# Patient Record
Sex: Female | Born: 1995 | Race: Black or African American | Hispanic: No | Marital: Single | Smoking: Never smoker
Health system: Southern US, Community
[De-identification: ages and names within clinical notes are randomized; demographics above are authoritative.]

## PROBLEM LIST (undated history)

## (undated) DIAGNOSIS — Z789 Other specified health status: Secondary | ICD-10-CM

## (undated) DIAGNOSIS — N200 Calculus of kidney: Secondary | ICD-10-CM

## (undated) HISTORY — PX: NO PAST SURGERIES: SHX2092

---
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
## 2016-09-21 ENCOUNTER — Other Ambulatory Visit: Payer: Self-pay | Admitting: Physician Assistant

## 2016-09-21 DIAGNOSIS — Z79899 Other long term (current) drug therapy: Secondary | ICD-10-CM

## 2016-09-21 DIAGNOSIS — Z3042 Encounter for surveillance of injectable contraceptive: Secondary | ICD-10-CM

## 2017-07-01 ENCOUNTER — Encounter (HOSPITAL_COMMUNITY): Payer: Self-pay | Admitting: Emergency Medicine

## 2017-07-01 ENCOUNTER — Emergency Department (HOSPITAL_COMMUNITY): Payer: Self-pay

## 2017-07-01 ENCOUNTER — Emergency Department (HOSPITAL_COMMUNITY)
Admission: EM | Admit: 2017-07-01 | Discharge: 2017-07-01 | Disposition: A | Discharge status: Home / Self Care | Payer: Self-pay | Attending: Emergency Medicine | Admitting: Emergency Medicine

## 2017-07-01 DIAGNOSIS — W228XXA Striking against or struck by other objects, initial encounter: Secondary | ICD-10-CM | POA: Insufficient documentation

## 2017-07-01 DIAGNOSIS — S92514A Nondisplaced fracture of proximal phalanx of right lesser toe(s), initial encounter for closed fracture: Secondary | ICD-10-CM | POA: Insufficient documentation

## 2017-07-01 DIAGNOSIS — S92504A Nondisplaced unspecified fracture of right lesser toe(s), initial encounter for closed fracture: Secondary | ICD-10-CM

## 2017-07-01 DIAGNOSIS — Y999 Unspecified external cause status: Secondary | ICD-10-CM | POA: Insufficient documentation

## 2017-07-01 DIAGNOSIS — Y939 Activity, unspecified: Secondary | ICD-10-CM | POA: Insufficient documentation

## 2017-07-01 DIAGNOSIS — Y929 Unspecified place or not applicable: Secondary | ICD-10-CM | POA: Insufficient documentation

## 2017-07-01 MED ORDER — IBUPROFEN 400 MG PO TABS
400.0000 mg | ORAL_TABLET | Freq: Once | ORAL | Status: AC
  Administered 2017-07-01: 400 mg via ORAL
  Filled 2017-07-01: qty 1

## 2017-07-01 MED ORDER — TRAMADOL HCL 50 MG PO TABS
50.0000 mg | ORAL_TABLET | Freq: Once | ORAL | Status: AC
  Administered 2017-07-01: 50 mg via ORAL
  Filled 2017-07-01: qty 1

## 2017-07-01 MED ORDER — TRAMADOL HCL 50 MG PO TABS: 50.0000 mg | ORAL_TABLET | Freq: Four times a day (QID) | ORAL | 0 refills | Status: DC | PRN

## 2017-07-01 NOTE — Discharge Instructions (Signed)
Rest, Ice intermittently (in the first 24-48 hours), Gentle compression with an Ace wrap, and elevate (Limb above the level of the heart)   Take up to 800mg  of ibuprofen (that is usually 4 over the counter pills)  3 times a day for 5 days. Take with food.  For breakthrough pain you may take Tramadol. Do not drink alcohol drive or operate heavy machinery when taking Tramadol.  Please follow with your primary care doctor in the next 2 days for a check-up. They must obtain records for further management.   Do not hesitate to return to the Emergency Department for any new, worsening or concerning symptoms.

## 2017-07-01 NOTE — ED Triage Notes (Signed)
Pt to ED with complaints of Right toe pain after hitting it on a dresser yesterday.

## 2017-07-01 NOTE — Progress Notes (Signed)
Orthopedic Tech Progress Note Patient Details:  Sherilyn Cootersabella J Debroux 1996-03-25 [id number]  Ortho Devices Type of Ortho Device: Crutches, Postop shoe/boot, Buddy tape Ortho Device/Splint Location: right foot/ large toe Ortho Device/Splint Interventions: Application, Adjustment   Alvina ChouWilliams, Jovon Winterhalter C 07/01/2017, 7:14 PM

## 2017-07-01 NOTE — ED Provider Notes (Signed)
[facility] Provider Note   CSN: [id number] Arrival date & time: 07/01/17  1521     History   Chief Complaint Chief Complaint  Patient presents with  . Toe Injury    HPI   Blood pressure 121/71, pulse 94, temperature 97.9 F (36.6 C), temperature source Oral, resp. rate 16, height 5\' 2"  (1.575 m), weight 56.7 kg (125 lb), SpO2 99 %.  Leah Martin is a 21 y.o. female complaining of right second toe pain and bruising after she stubbed it on a dresser earlier in the day. Pain has been severe and exacerbated by movement and palpation. Pain medication taken prior to arrival. Ambulatory with pain. Described as throbbing.   History reviewed. No pertinent past medical history.  There are no active problems to display for this patient.   History reviewed. No pertinent surgical history.  OB History    No data available       Home Medications    Prior to Admission medications   Medication Sig Start Date End Date Taking? Authorizing Provider  medroxyPROGESTERone (DEPO-PROVERA) 150 MG/ML injection Inject 150 mg into the muscle every 3 (three) months.   Yes [provider]    Family History No family history on file.  Social History Social History  Substance Use Topics  . Smoking status: Never Smoker  . Smokeless tobacco: Never Used  . Alcohol use No     Allergies   Patient has no known allergies.   Review of Systems Review of Systems  A complete review of systems was obtained and all systems are negative except as noted in the HPI and PMH.    Physical Exam Updated Vital Signs BP 121/71 (BP Location: Left Arm)   Pulse 94   Temp 97.9 F (36.6 C) (Oral)   Resp 16   Ht 5\' 2"  (1.575 m)   Wt 56.7 kg (125 lb)   SpO2 99%   BMI 22.86 kg/m   Physical Exam  Constitutional: She is oriented to person, place, and time. She appears well-developed and well-nourished. No distress.  HENT:  Head: Normocephalic and  atraumatic.  Mouth/Throat: Oropharynx is clear and moist.  Eyes: Pupils are equal, round, and reactive to light. Conjunctivae and EOM are normal.  Neck: Normal range of motion.  Cardiovascular: Normal rate, regular rhythm and intact distal pulses.   Pulmonary/Chest: Effort normal and breath sounds normal.  Abdominal: Soft. There is no tenderness.  Musculoskeletal: She exhibits edema and tenderness.  Ecchymoses and tenderness with trace swelling to right second toe. Wound is closed with no lacerations overlying, distally neurovascularly intact.  Neurological: She is alert and oriented to person, place, and time.  Skin: She is not diaphoretic.  Psychiatric: She has a normal mood and affect.  Nursing note and vitals reviewed.    ED Treatments / Results  Labs (all labs ordered are listed, but only abnormal results are displayed) Labs Reviewed - No data to display  EKG  EKG Interpretation None       Radiology Dg Foot Complete Right  Result Date: 07/01/2017 CLINICAL DATA:  Right second toe pain EXAM: RIGHT FOOT COMPLETE - 3+ VIEW COMPARISON:  None. FINDINGS: Nondisplaced oblique fracture of the right second proximal phalanx. No intra-articular extension. No other fracture. The other bones of the right foot are normal. IMPRESSION: Nondisplaced, oblique, extra-articular right second proximal phalangeal fracture. Electronically Signed   By: Deatra RobinsonKevin  Herman M.D.   On: 07/01/2017 18:20    Procedures Procedures (  including critical care time)  Medications Ordered in ED Medications - No data to display   Initial Impression / Assessment and Plan / ED Course  I have reviewed the triage vital signs and the nursing notes.  Pertinent labs & imaging results that were available during my care of the patient were reviewed by me and considered in my medical decision making (see chart for details).    Vitals:   07/01/17 1555  BP: 121/71  Pulse: 94  Resp: 16  Temp: 97.9 F (36.6 C)    TempSrc: Oral  SpO2: 99%  Weight: 56.7 kg (125 lb)  Height: 5\' 2"  (1.575 m)    Medications  ibuprofen (ADVIL,MOTRIN) tablet 400 mg (400 mg Oral Given 07/01/17 1856)  traMADol (ULTRAM) tablet 50 mg (50 mg Oral Given 07/01/17 1856)    Leah Martin is 21 y.o. female presenting with Toe pain after stubbing it against a dresser. Wound is closed, proximal phalanx fracture. Buddy taped with postop shoe and crutches, short prescription of tramadol for pain control.  Evaluation does not show pathology that would require ongoing emergent intervention or inpatient treatment. Pt is hemodynamically stable and mentating appropriately. Discussed findings and plan with patient/guardian, who agrees with care plan. All questions answered. Return precautions discussed and outpatient follow up given.      Final Clinical Impressions(s) / ED Diagnoses   Final diagnoses:  None    New Prescriptions New Prescriptions   No medications on file     [name] 07/01/17 1859    Lavera Guise, MD 07/03/17 [phone]

## 2017-07-01 NOTE — ED Notes (Signed)
Declined W/C at D/C and was escorted to lobby by RN. 

## 2017-10-26 ENCOUNTER — Emergency Department (HOSPITAL_BASED_OUTPATIENT_CLINIC_OR_DEPARTMENT_OTHER): Payer: Medicaid Other

## 2017-10-26 ENCOUNTER — Other Ambulatory Visit: Payer: Self-pay

## 2017-10-26 ENCOUNTER — Encounter (HOSPITAL_BASED_OUTPATIENT_CLINIC_OR_DEPARTMENT_OTHER): Payer: Self-pay | Admitting: Emergency Medicine

## 2017-10-26 ENCOUNTER — Emergency Department (HOSPITAL_BASED_OUTPATIENT_CLINIC_OR_DEPARTMENT_OTHER)
Admission: EM | Admit: 2017-10-26 | Discharge: 2017-10-26 | Disposition: A | Discharge status: Home / Self Care | Payer: Medicaid Other | Attending: Emergency Medicine | Admitting: Emergency Medicine

## 2017-10-26 DIAGNOSIS — N12 Tubulo-interstitial nephritis, not specified as acute or chronic: Secondary | ICD-10-CM

## 2017-10-26 DIAGNOSIS — B9689 Other specified bacterial agents as the cause of diseases classified elsewhere: Secondary | ICD-10-CM | POA: Insufficient documentation

## 2017-10-26 DIAGNOSIS — N760 Acute vaginitis: Secondary | ICD-10-CM | POA: Insufficient documentation

## 2017-10-26 LAB — BASIC METABOLIC PANEL
Anion gap: 7 (ref 5–15)
BUN: 10 mg/dL (ref 6–20)
CALCIUM: 9 mg/dL (ref 8.9–10.3)
CO2: 24 mmol/L (ref 22–32)
Chloride: 108 mmol/L (ref 101–111)
Creatinine, Ser: 0.72 mg/dL (ref 0.44–1.00)
GFR calc non Af Amer: >60 mL/min (ref >60)
Glucose, Bld: 98 mg/dL (ref 65–99)
Potassium: 4.2 mmol/L (ref 3.5–5.1)
SODIUM: 139 mmol/L (ref 135–145)

## 2017-10-26 LAB — URINALYSIS, ROUTINE W REFLEX MICROSCOPIC
Bilirubin Urine: NEGATIVE
GLUCOSE, UA: NEGATIVE mg/dL
HGB URINE DIPSTICK: NEGATIVE
Ketones, ur: NEGATIVE mg/dL
LEUKOCYTES UA: NEGATIVE
Nitrite: POSITIVE — AB
PROTEIN: NEGATIVE mg/dL
Specific Gravity, Urine: 1.025 (ref 1.005–1.030)
pH: 7 (ref 5.0–8.0)

## 2017-10-26 LAB — CBC WITH DIFFERENTIAL/PLATELET
BASOS PCT: 0 %
Basophils Absolute: 0 10*3/uL (ref 0.0–0.1)
EOS ABS: 0 10*3/uL (ref 0.0–0.7)
EOS PCT: 1 %
HCT: 39.2 % (ref 36.0–46.0)
Hemoglobin: 13.2 g/dL (ref 12.0–15.0)
LYMPHS ABS: 2.4 10*3/uL (ref 0.7–4.0)
Lymphocytes Relative: 42 %
MCH: 31.3 pg (ref 26.0–34.0)
MCHC: 33.7 g/dL (ref 30.0–36.0)
MCV: 92.9 fL (ref 78.0–100.0)
MONOS PCT: 13 %
Monocytes Absolute: 0.7 10*3/uL (ref 0.1–1.0)
Neutro Abs: 2.5 10*3/uL (ref 1.7–7.7)
Neutrophils Relative %: 44 %
PLATELETS: 198 10*3/uL (ref 150–400)
RBC: 4.22 MIL/uL (ref 3.87–5.11)
RDW: 12.2 % (ref 11.5–15.5)
WBC: 5.6 10*3/uL (ref 4.0–10.5)

## 2017-10-26 LAB — URINALYSIS, MICROSCOPIC (REFLEX)

## 2017-10-26 LAB — PREGNANCY, URINE: PREG TEST UR: NEGATIVE

## 2017-10-26 LAB — WET PREP, GENITAL
SPERM: NONE SEEN
Trich, Wet Prep: NONE SEEN
YEAST WET PREP: NONE SEEN

## 2017-10-26 MED ORDER — METRONIDAZOLE 500 MG PO TABS: 500.0000 mg | ORAL_TABLET | Freq: Two times a day (BID) | ORAL | 0 refills | Status: DC

## 2017-10-26 MED ORDER — CIPROFLOXACIN HCL 500 MG PO TABS: 500.0000 mg | ORAL_TABLET | Freq: Two times a day (BID) | ORAL | 0 refills | Status: DC

## 2017-10-26 MED ORDER — CEFTRIAXONE SODIUM 1 G IJ SOLR
1.0000 g | Freq: Once | INTRAMUSCULAR | Status: AC
  Administered 2017-10-26: 1 g via INTRAMUSCULAR
  Filled 2017-10-26: qty 10

## 2017-10-26 NOTE — ED Triage Notes (Signed)
Patient states that she is having pain to her bilateral flank - Left greater than right. Was recently dx with a UTI and treated for it.

## 2017-10-26 NOTE — ED Notes (Signed)
PA informed x 2 that pelvic set up was ready

## 2017-10-26 NOTE — Discharge Instructions (Signed)
You have a urinary tract infection, given symptoms and pain to your right flank this is likely early kidney infection. Take ciprofloxacin as prescribed. Vaginal swabs showed bacterial vaginosis, this is a vaginal infection and not a sexually transmitted disease. Take Flagyl for this. Note that Flagyl can cause nausea and abdominal pain, you may take with food if this happens. Do not consume alcohol while taking Flagyl or a week afterwards as it will cause severe nausea, vomiting, abdominal pain. Take Tylenol or ibuprofen for pain. Stay well-hydrated. Return to the emergency department if you develop fevers, chills, abdominal pain, inability to urinate. You declined STD treatment today, your vaginal swab testing for gonorrhea and chlamydia are pending. You will be called in 1-2 days if these results are positive. If they are positive you need to alert your partners and seek treatment.

## 2017-10-26 NOTE — ED Provider Notes (Signed)
[facility] EMERGENCY DEPARTMENT Provider Note   CSN: [id number] Arrival date & time: 10/26/17  1135     History   Chief Complaint Chief Complaint  Patient presents with  . Flank Pain    HPI Leah Martin is a 22 y.o. female with history of recent UTI is here for evaluation of dysuria, frequency, darker urine, suprapubic/bladder fullness and mild pain that radiates to hurt right flank for the last week. . She finished taken Bactrim 2 weeks ago, she was asymptomatic for about a week but symptoms returned. She denies fevers, chills, nausea, vomiting, chest pain, shortness of breath, abnormal vaginal bleeding, diarrhea, constipation. Since finishing back trauma she has noticed increased vaginal discharge, she has history of recent infections in the past and thinks that this is similar. She is sexually active with out condom use. Denies history of STD, PID. Partner is not having any symptoms. She appears to not be concerned about possible STDs. No previous abdominal surgeries. No history of kidney stones. No interventions PTA. No aggravating or alleviating factors.  HPI  History reviewed. No pertinent past medical history.  There are no active problems to display for this patient.   History reviewed. No pertinent surgical history.  OB History    No data available       Home Medications    Prior to Admission medications   Medication Sig Start Date End Date Taking? Authorizing Provider  ciprofloxacin (CIPRO) 500 MG tablet Take 1 tablet (500 mg total) by mouth 2 (two) times daily. 10/26/17   Liberty Handy, PA-C  medroxyPROGESTERone (DEPO-PROVERA) 150 MG/ML injection Inject 150 mg into the muscle every 3 (three) months.    [provider]  metroNIDAZOLE (FLAGYL) 500 MG tablet Take 1 tablet (500 mg total) by mouth 2 (two) times daily. 10/26/17   Liberty Handy, PA-C  traMADol (ULTRAM) 50 MG tablet Take 1 tablet (50 mg total) by mouth every 6 (six) hours  as needed. 07/01/17   Pisciotta, Mardella Layman    Family History History reviewed. No pertinent family history.  Social History Social History   Tobacco Use  . Smoking status: Never Smoker  . Smokeless tobacco: Never Used  Substance Use Topics  . Alcohol use: No  . Drug use: No     Allergies   Patient has no known allergies.   Review of Systems Review of Systems  Gastrointestinal: Positive for abdominal pain.  Genitourinary: Positive for difficulty urinating, dysuria, frequency, urgency and vaginal discharge.  All other systems reviewed and are negative.    Physical Exam Updated Vital Signs BP (!) 117/56 (BP Location: Left Arm)   Pulse 73   Temp 98.4 F (36.9 C) (Oral)   Resp 16   Ht 5\' 2"  (1.575 m)   Wt 56.7 kg (125 lb)   SpO2 100%   BMI 22.86 kg/m   Physical Exam  Constitutional: She is oriented to person, place, and time. She appears well-developed and well-nourished. No distress.  NAD. Nontoxic.  HENT:  Head: Normocephalic and atraumatic.  Right Ear: External ear normal.  Left Ear: External ear normal.  Nose: Nose normal.  Moist mucous membranes.  Eyes: Conjunctivae and EOM are normal. No scleral icterus.  Neck: Normal range of motion. Neck supple.  Cardiovascular: Normal rate, regular rhythm and normal heart sounds.  No murmur heard. Pulmonary/Chest: Effort normal and breath sounds normal. She has no wheezes.  Abdominal: Soft. There is no tenderness.  No suprapubic tenderness. She has mild right  CVA tenderness. No guarding, rigidity, rebound.  Genitourinary: Vaginal discharge found.  Genitourinary Comments:  External genitalia normal without erythema, edema, tenderness, discharge or lesions.  No groin lymphadenopathy.  Vaginal mucosa cervix normal, pink with small amount of thin/white grayish discharge. Negative whiff test.  Uterus in midline, smooth, not enlarged or tender. No CMT. Non palpable adnexa.  Musculoskeletal: Normal range of motion. She  exhibits no deformity.  Neurological: She is alert and oriented to person, place, and time.  Skin: Skin is warm and dry. Capillary refill takes less than 2 seconds.  Psychiatric: She has a normal mood and affect. Her behavior is normal. Judgment and thought content normal.  Nursing note and vitals reviewed.    ED Treatments / Results  Labs (all labs ordered are listed, but only abnormal results are displayed) Labs Reviewed  WET PREP, GENITAL - Abnormal; Notable for the following components:      Result Value   Clue Cells Wet Prep HPF POC PRESENT (*)    WBC, Wet Prep HPF POC MANY (*)    All other components within normal limits  URINALYSIS, ROUTINE W REFLEX MICROSCOPIC - Abnormal; Notable for the following components:   APPearance CLOUDY (*)    Nitrite POSITIVE (*)    All other components within normal limits  URINALYSIS, MICROSCOPIC (REFLEX) - Abnormal; Notable for the following components:   Bacteria, UA MANY (*)    Squamous Epithelial / LPF 0-5 (*)    All other components within normal limits  URINE CULTURE  PREGNANCY, URINE  CBC WITH DIFFERENTIAL/PLATELET  BASIC METABOLIC PANEL  GC/CHLAMYDIA PROBE AMP ([place]) NOT AT [facility]    EKG  EKG Interpretation None       Radiology Ct Renal Stone Study  Result Date: 10/26/2017 CLINICAL DATA:  Right-sided flank pain for 1 month EXAM: CT ABDOMEN AND PELVIS WITHOUT CONTRAST TECHNIQUE: Multidetector CT imaging of the abdomen and pelvis was performed following the standard protocol without IV contrast. COMPARISON:  None. FINDINGS: Lower chest: No acute abnormality. Hepatobiliary: No focal liver abnormality is seen. No gallstones, gallbladder wall thickening, or biliary dilatation. Pancreas: Unremarkable. No pancreatic ductal dilatation or surrounding inflammatory changes. Spleen: Normal in size without focal abnormality. Adrenals/Urinary Tract: Adrenal glands are unremarkable. Kidneys are normal, without renal calculi, focal lesion,  or hydronephrosis. Bladder is unremarkable. Stomach/Bowel: Stomach is within normal limits. Appendix appears normal. No evidence of bowel wall thickening, distention, or inflammatory changes. Vascular/Lymphatic: No significant vascular findings are present. No enlarged abdominal or pelvic lymph nodes. Reproductive: Uterus and bilateral adnexa are unremarkable. Other: Some soft tissue inflammatory changes are noted just above the pubic symphysis anteriorly. Musculoskeletal: No acute or significant osseous findings. IMPRESSION: No renal calculi or obstructive changes are noted. Mild soft tissue inflammation is noted just above the pubic symphysis anteriorly. This is of uncertain significance. Correlation with the physical exam and any recent trauma is recommended. Electronically Signed   By: Alcide CleverMark  Lukens M.D.   On: 10/26/2017 15:13    Procedures Procedures (including critical care time)  Medications Ordered in ED Medications  cefTRIAXone (ROCEPHIN) injection 1 g (1 g Intramuscular Given 10/26/17 1541)     Initial Impression / Assessment and Plan / ED Course  I have reviewed the triage vital signs and the nursing notes.  Pertinent labs & imaging results that were available during my care of the patient were reviewed by me and considered in my medical decision making (see chart for details).  Clinical Course as of Oct 26 1721  Tue Oct 26, 2017  1442 Nitrite: (!) POSITIVE [CG]  1442 Appearance: (!) CLOUDY [CG]  1442 Pulse Rate: (!) 121 [CG]    Clinical Course User Index [CG] Liberty Handy, PA-C   Given right CVA tenderness in setting of UTI likely pyelonephritis. Will obtain labs and CT renal. Plan for pelvic exam.   Final Clinical Impressions(s) / ED Diagnoses   No leukocytosis.  Creatinine normal. Negative pregnancy. CT renal w/o signs of obstructing stone, hydronephrosis, appendic appears normal.  Pelvic and abdominal exam is benign without peritoneal signs, only scant amount of  gray/thin vaginal discharge consistent with bacterial vaginosis, clue cells present on wet prep. I called pharmacy who recommended ciprofloxacin in this young patient without comorbidities. Her urine was sent for culture. Will discharge with Flagyl for bacterial vaginosis. Discussed return precautions. Final diagnoses:  Pyelonephritis  Bacterial vaginosis    ED Discharge Orders        Ordered    ciprofloxacin (CIPRO) 500 MG tablet  2 times daily     10/26/17 1658    metroNIDAZOLE (FLAGYL) 500 MG tablet  2 times daily     10/26/17 1658       Liberty Handy, [place] 10/26/17 1723    Pricilla Loveless, MD 10/27/17 [phone]

## 2017-10-26 NOTE — ED Notes (Signed)
ED Provider at bedside. 

## 2017-10-27 LAB — GC/CHLAMYDIA PROBE AMP (~~LOC~~) NOT AT ARMC
Chlamydia: NEGATIVE
Neisseria Gonorrhea: NEGATIVE

## 2017-10-28 LAB — URINE CULTURE

## 2017-10-29 ENCOUNTER — Telehealth: Payer: Self-pay

## 2017-10-29 NOTE — Telephone Encounter (Signed)
Post ED Visit - Positive Culture Follow-up  Culture report reviewed by antimicrobial stewardship pharmacist:  [x]  Enzo BiNathan Batchelder, Pharm.D. []  Celedonio MiyamotoJeremy Frens, Pharm.D., BCPS AQ-ID []  Garvin FilaMike Maccia, Pharm.D., BCPS []  Georgina PillionElizabeth Martin, Pharm.D., BCPS []  BediasMinh Pham, [address]Pharm.D., BCPS, AAHIVP []  Estella HuskMichelle Turner, Pharm.D., BCPS, AAHIVP []  Lysle Pearlachel Rumbarger, PharmD, BCPS []  Blake DivineShannon Parkey, PharmD []  Pollyann SamplesAndy Johnston, PharmD, BCPS  Positive urine culture Treated with Coprofloxacin, organism sensitive to the same and no further patient follow-up is required at this time.  Jerry CarasCullom, Ahnaf Caponi Burnett 10/29/2017, 10:04 AM

## 2018-07-12 ENCOUNTER — Encounter (HOSPITAL_COMMUNITY): Payer: Self-pay

## 2018-07-12 ENCOUNTER — Emergency Department (HOSPITAL_COMMUNITY)
Admission: EM | Admit: 2018-07-12 | Discharge: 2018-07-13 | Disposition: A | Discharge status: Home / Self Care | Payer: Self-pay | Attending: Emergency Medicine | Admitting: Emergency Medicine

## 2018-07-12 ENCOUNTER — Emergency Department (HOSPITAL_COMMUNITY): Payer: Self-pay

## 2018-07-12 ENCOUNTER — Other Ambulatory Visit: Payer: Self-pay

## 2018-07-12 DIAGNOSIS — M25511 Pain in right shoulder: Secondary | ICD-10-CM | POA: Insufficient documentation

## 2018-07-12 DIAGNOSIS — R071 Chest pain on breathing: Secondary | ICD-10-CM | POA: Insufficient documentation

## 2018-07-12 LAB — CBC
HEMATOCRIT: 43.8 % (ref 36.0–46.0)
Hemoglobin: 13.9 g/dL (ref 12.0–15.0)
MCH: 29.4 pg (ref 26.0–34.0)
MCHC: 31.7 g/dL (ref 30.0–36.0)
MCV: 92.8 fL (ref 80.0–100.0)
PLATELETS: 240 10*3/uL (ref 150–400)
RBC: 4.72 MIL/uL (ref 3.87–5.11)
RDW: 12.4 % (ref 11.5–15.5)
WBC: 8.7 10*3/uL (ref 4.0–10.5)
nRBC: 0 % (ref 0.0–0.2)

## 2018-07-12 LAB — I-STAT BETA HCG BLOOD, ED (MC, WL, AP ONLY): I-stat hCG, quantitative: <5 m[IU]/mL (ref <5)

## 2018-07-12 LAB — I-STAT TROPONIN, ED: TROPONIN I, POC: 0 ng/mL (ref 0.00–0.08)

## 2018-07-12 LAB — BASIC METABOLIC PANEL
ANION GAP: 8 (ref 5–15)
BUN: 12 mg/dL (ref 6–20)
CO2: 24 mmol/L (ref 22–32)
Calcium: 9.4 mg/dL (ref 8.9–10.3)
Chloride: 108 mmol/L (ref 98–111)
Creatinine, Ser: 0.7 mg/dL (ref 0.44–1.00)
GFR calc Af Amer: >60 mL/min (ref >60)
GFR calc non Af Amer: >60 mL/min (ref >60)
GLUCOSE: 102 mg/dL — AB (ref 70–99)
Potassium: 3.7 mmol/L (ref 3.5–5.1)
Sodium: 140 mmol/L (ref 135–145)

## 2018-07-12 NOTE — ED Triage Notes (Signed)
Pt reports that she has had rt side chest pain sharp 9/10. Pt reports with deep breathing pain worsens and rt side shoulder pain. Pt states that she has had a similar episode 3 days ago. Pt reports smoking Hx.

## 2018-07-12 NOTE — ED Notes (Signed)
Pt called from triage with no answer 

## 2018-07-13 NOTE — ED Notes (Signed)
Pt not in the lobby 

## 2019-06-23 ENCOUNTER — Telehealth: Payer: Self-pay | Admitting: *Deleted

## 2019-06-23 DIAGNOSIS — O3680X0 Pregnancy with inconclusive fetal viability, not applicable or unspecified: Secondary | ICD-10-CM

## 2019-06-23 NOTE — Telephone Encounter (Signed)
Received a voicemail from [place] at [facility] from 06/22/19 pm stating they have seen Leah Martin . State she told them she went to the ER 9/26 and got proof of pregnancy and states they did bhcg and told her she is about 2 months along.  She states she also was treated for a UTI. Leah Martin denies bleeding or cramping. She states she came off ocp's in July and then has 2 days of bleeding at end of August.   Leah Martin states she did an [place] which showed a large gestational sac large enough she expected to see a baby - looked around 8-10 wk size; but she could not see a baby. Is referring to [place] for follow up.  I called and scheduled [place] for 10/19/ 20 1:00 because No viability [place] are done on Friday's. I called [name] and left a message I am calling because you were referred to [place]. We have scheduled you an [place] for 07/03/19 at 1:00 at [address]. Please call us if questions.  I called [facility] and left a message we received your message and have assumed her care. I sent a message to registrars to put her on schedule for results after [place].  Linda,RN

## 2019-06-23 NOTE — Telephone Encounter (Signed)
I called Leah Martin and she confirmed she got my earlier message. I reviewed with her that her [place] appointment and that after the [place] she will come to our office for the results. I also instructed her if she has bleeding or severe pain to go to [facility] for evaluation. She voices understanding.  Jacques Navy

## 2019-07-03 ENCOUNTER — Other Ambulatory Visit: Payer: Self-pay

## 2019-07-03 ENCOUNTER — Ambulatory Visit (HOSPITAL_COMMUNITY)
Admission: RE | Admit: 2019-07-03 | Discharge: 2019-07-03 | Disposition: A | Discharge status: Home / Self Care | Payer: Self-pay | Source: Ambulatory Visit | Attending: Medical | Admitting: Medical

## 2019-07-03 ENCOUNTER — Ambulatory Visit (INDEPENDENT_AMBULATORY_CARE_PROVIDER_SITE_OTHER): Payer: Self-pay | Admitting: Emergency Medicine

## 2019-07-03 DIAGNOSIS — O3680X0 Pregnancy with inconclusive fetal viability, not applicable or unspecified: Secondary | ICD-10-CM

## 2019-07-03 NOTE — Progress Notes (Signed)
Agree with A & P. 

## 2019-07-03 NOTE — Progress Notes (Signed)
Pt here today for u/s results. Results reviewed with Dr Rip Harbour. Verbal order to have repeat u/s done in 10 days.   Pt informed of u/s date 10/29 @ 10am and instructed to arrive 15 minutes early with a full bladder. Pt reports occasional mild cramping and denies any bleeding. Pt encouraged to be seen at the hospital for severe pain or bleeding that soaks a pad in an hour or less. Pt verbalized understanding and had no further questions.   Loma Sousa, RN 07/03/19   [phone]

## 2019-07-13 ENCOUNTER — Ambulatory Visit (HOSPITAL_COMMUNITY)
Admission: RE | Admit: 2019-07-13 | Discharge: 2019-07-13 | Disposition: A | Discharge status: Home / Self Care | Payer: Self-pay | Source: Ambulatory Visit | Attending: Obstetrics and Gynecology | Admitting: Obstetrics and Gynecology

## 2019-07-13 ENCOUNTER — Ambulatory Visit (INDEPENDENT_AMBULATORY_CARE_PROVIDER_SITE_OTHER): Payer: Self-pay | Admitting: Obstetrics & Gynecology

## 2019-07-13 ENCOUNTER — Other Ambulatory Visit: Payer: Self-pay

## 2019-07-13 DIAGNOSIS — O039 Complete or unspecified spontaneous abortion without complication: Secondary | ICD-10-CM

## 2019-07-13 DIAGNOSIS — O3680X0 Pregnancy with inconclusive fetal viability, not applicable or unspecified: Secondary | ICD-10-CM | POA: Insufficient documentation

## 2019-07-13 MED ORDER — IBUPROFEN 800 MG PO TABS: 800.0000 mg | ORAL_TABLET | Freq: Three times a day (TID) | ORAL | 1 refills | Status: DC | PRN

## 2019-07-13 MED ORDER — MISOPROSTOL 200 MCG PO TABS: ORAL_TABLET | ORAL | 0 refills | Status: DC

## 2019-07-13 NOTE — Addendum Note (Signed)
Addended by: Emily Filbert on: 07/13/2019 10:58 AM   Modules accepted: Orders

## 2019-07-13 NOTE — Progress Notes (Signed)
   Subjective:    Patient ID: Leah Martin, female    DOB: 1996/04/11, 23 y.o.   MRN: [id number]  HPI 23 yo single G0 here for follow up u/s. She had the u/s today that met definitive criteria for failed pregnancy. She does want a pregnancy, does not want contraception.   Review of Systems     Objective:   Physical Exam Breathing, conversing, and ambulating normally Well nourished, well hydrated Black female, no apparent distress     Assessment & Plan:  Preventative care- she would like a flu vaccine but we are out of stock Miscarriage- type and screen today She was offered watchful waiting versus cytotec. She opts for cytotec. Rec PNVs daily to help prevent ONTDs She will get her pap smear when her medicaid becomes active.

## 2019-07-13 NOTE — Addendum Note (Signed)
Addended by: Grier Mitts L on: 07/13/2019 11:00 AM   Modules accepted: Orders

## 2019-07-14 LAB — ABO/RH: Rh Factor: POSITIVE

## 2019-08-05 ENCOUNTER — Encounter (HOSPITAL_COMMUNITY): Payer: Self-pay

## 2019-08-05 ENCOUNTER — Inpatient Hospital Stay (HOSPITAL_COMMUNITY): Payer: Medicaid Other

## 2019-08-05 ENCOUNTER — Other Ambulatory Visit: Payer: Self-pay

## 2019-08-05 ENCOUNTER — Inpatient Hospital Stay (HOSPITAL_COMMUNITY)
Admission: AD | Admit: 2019-08-05 | Discharge: 2019-08-05 | Disposition: A | Discharge status: Home / Self Care | Payer: Medicaid Other | Attending: Obstetrics & Gynecology | Admitting: Obstetrics & Gynecology

## 2019-08-05 DIAGNOSIS — O4690 Antepartum hemorrhage, unspecified, unspecified trimester: Secondary | ICD-10-CM

## 2019-08-05 DIAGNOSIS — Z792 Long term (current) use of antibiotics: Secondary | ICD-10-CM | POA: Insufficient documentation

## 2019-08-05 DIAGNOSIS — Z79899 Other long term (current) drug therapy: Secondary | ICD-10-CM | POA: Insufficient documentation

## 2019-08-05 DIAGNOSIS — O034 Incomplete spontaneous abortion without complication: Secondary | ICD-10-CM | POA: Diagnosis present

## 2019-08-05 DIAGNOSIS — O021 Missed abortion: Secondary | ICD-10-CM | POA: Insufficient documentation

## 2019-08-05 LAB — CBC
HCT: 36.2 % (ref 36.0–46.0)
Hemoglobin: 12 g/dL (ref 12.0–15.0)
MCH: 30.5 pg (ref 26.0–34.0)
MCHC: 33.1 g/dL (ref 30.0–36.0)
MCV: 91.9 fL (ref 80.0–100.0)
Platelets: 243 10*3/uL (ref 150–400)
RBC: 3.94 MIL/uL (ref 3.87–5.11)
RDW: 12.8 % (ref 11.5–15.5)
WBC: 10.4 10*3/uL (ref 4.0–10.5)
nRBC: 0 % (ref 0.0–0.2)

## 2019-08-05 LAB — URINALYSIS, ROUTINE W REFLEX MICROSCOPIC
Bacteria, UA: NONE SEEN
Bilirubin Urine: NEGATIVE
Glucose, UA: NEGATIVE mg/dL
Ketones, ur: NEGATIVE mg/dL
Nitrite: NEGATIVE
Protein, ur: 30 mg/dL — AB
RBC / HPF: >50 RBC/hpf — ABNORMAL HIGH (ref 0–5)
Specific Gravity, Urine: 1.021 (ref 1.005–1.030)
pH: 7 (ref 5.0–8.0)

## 2019-08-05 LAB — COMPREHENSIVE METABOLIC PANEL
ALT: 21 U/L (ref 0–44)
AST: 14 U/L — ABNORMAL LOW (ref 15–41)
Albumin: 3.4 g/dL — ABNORMAL LOW (ref 3.5–5.0)
Alkaline Phosphatase: 53 U/L (ref 38–126)
Anion gap: 10 (ref 5–15)
BUN: 6 mg/dL (ref 6–20)
CO2: 21 mmol/L — ABNORMAL LOW (ref 22–32)
Calcium: 8.8 mg/dL — ABNORMAL LOW (ref 8.9–10.3)
Chloride: 105 mmol/L (ref 98–111)
Creatinine, Ser: 0.7 mg/dL (ref 0.44–1.00)
GFR calc Af Amer: >60 mL/min (ref >60)
GFR calc non Af Amer: >60 mL/min (ref >60)
Glucose, Bld: 100 mg/dL — ABNORMAL HIGH (ref 70–99)
Potassium: 3.7 mmol/L (ref 3.5–5.1)
Sodium: 136 mmol/L (ref 135–145)
Total Bilirubin: 0.6 mg/dL (ref 0.3–1.2)
Total Protein: 6.4 g/dL — ABNORMAL LOW (ref 6.5–8.1)

## 2019-08-05 LAB — HCG, QUANTITATIVE, PREGNANCY: hCG, Beta Chain, Quant, S: 865 m[IU]/mL — ABNORMAL HIGH (ref <5)

## 2019-08-05 LAB — WET PREP, GENITAL
Sperm: NONE SEEN
Trich, Wet Prep: NONE SEEN
Yeast Wet Prep HPF POC: NONE SEEN

## 2019-08-05 LAB — POCT PREGNANCY, URINE: Preg Test, Ur: POSITIVE — AB

## 2019-08-05 NOTE — MAU Provider Note (Addendum)
History     CSN: [id number]  Arrival date and time: 08/05/19 4098   First Provider Initiated Contact with Patient 08/05/19 1922      Chief Complaint  Patient presents with  . Abdominal Pain  . Possible Pregnancy  . Vaginal Bleeding   Leah Martin is a 23 y.o. G1P0010 at Unknown who presents to MAU for vaginal bleeding which began 07/26/2019 after she took the misoprostol pills that were prescribed for her on 07/13/2019 when she was diagnosed with a failed pregnancy. Pt reports the bleeding had been getting lighter, but the last three days has gotten heavier.  Passing blood clots? yes Blood soaking clothes? no Lightheaded/dizzy? no Significant pelvic pain or cramping? yes, pt rates pelvic pain as 9/10 with pressure Passed any tissue? no  Blood Type? B positive Allergies? NKDA Current medications? none Current PNC & next appt? none  Pt denies vaginal discharge/odor/itching. Pt denies N/V, abdominal pain, constipation, diarrhea, or urinary problems. Pt denies fever, chills, fatigue, sweating or changes in appetite. Pt denies SOB or chest pain. Pt denies dizziness, HA, light-headedness, weakness.   OB History    Gravida  1   Para      Term      Preterm      AB  1   Living        SAB  1   TAB      Ectopic      Multiple      Live Births              History reviewed. No pertinent past medical history.  History reviewed. No pertinent surgical history.  Family History  Problem Relation Age of Onset  . Kidney disease Mother     Social History   Tobacco Use  . Smoking status: Never Smoker  . Smokeless tobacco: Never Used  Substance Use Topics  . Alcohol use: No  . Drug use: No    Allergies: No Known Allergies  Medications Prior to Admission  Medication Sig Dispense Refill Last Dose  . ciprofloxacin (CIPRO) 500 MG tablet Take 1 tablet (500 mg total) by mouth 2 (two) times daily. 14 tablet 0   . ibuprofen (ADVIL) 800 MG tablet  Take 1 tablet (800 mg total) by mouth every 8 (eight) hours as needed. 60 tablet 1   . medroxyPROGESTERone (DEPO-PROVERA) 150 MG/ML injection Inject 150 mg into the muscle every 3 (three) months.     . metroNIDAZOLE (FLAGYL) 500 MG tablet Take 1 tablet (500 mg total) by mouth 2 (two) times daily. 14 tablet 0   . misoprostol (CYTOTEC) 200 MCG tablet Take 4 pills by mouth 4 tablet 0   . traMADol (ULTRAM) 50 MG tablet Take 1 tablet (50 mg total) by mouth every 6 (six) hours as needed. 11 tablet 0     Review of Systems  Constitutional: Negative for chills, diaphoresis, fatigue and fever.  Eyes: Negative for visual disturbance.  Respiratory: Negative for shortness of breath.   Cardiovascular: Negative for chest pain.  Gastrointestinal: Negative for abdominal pain, constipation, diarrhea, nausea and vomiting.  Genitourinary: Positive for pelvic pain and vaginal bleeding. Negative for dysuria, flank pain, frequency, urgency and vaginal discharge.  Neurological: Negative for dizziness, weakness, light-headedness and headaches.   Physical Exam   Blood pressure 119/67, pulse 98, temperature 99.3 F (37.4 C), temperature source Oral, resp. rate 16, height  (1.575 m), weight 64 kg, SpO2 100 %.  Patient Vitals for the past 24  hrs:  BP Temp Temp src Pulse Resp SpO2 Height Weight  08/05/19 1839 119/67 99.3 F (37.4 C) Oral 98 16 100 % -- --  08/05/19 1835 -- -- -- -- -- -- 5\' 2"  (1.575 m) 64 kg   Physical Exam  Constitutional: She is oriented to person, place, and time. She appears well-developed and well-nourished. No distress.  HENT:  Head: Normocephalic and atraumatic.  Respiratory: Effort normal.  GI: Soft. She exhibits no distension and no mass. There is abdominal tenderness in the left lower quadrant. There is no rebound and no guarding.  Genitourinary: There is no rash, tenderness or lesion on the right labia. There is no rash, tenderness or lesion on the left labia. Uterus is not  enlarged and not tender. Cervix exhibits no motion tenderness, no discharge and no friability. Right adnexum displays no mass, no tenderness and no fullness. Left adnexum displays no mass, no tenderness and no fullness.    Vaginal bleeding (minimal blood in vaginal vault, minute bleeding from external os) present.     No vaginal discharge or tenderness.  There is bleeding (minimal blood in vaginal vault, minute bleeding from external os) in the vagina. No tenderness in the vagina.    Genitourinary Comments: CE: long/closed   Neurological: She is alert and oriented to person, place, and time.  Skin: Skin is warm and dry. She is not diaphoretic.  Psychiatric: She has a normal mood and affect. Her behavior is normal. Judgment and thought content normal.   Results for orders placed or performed during the hospital encounter of 08/05/19 (from the past 24 hour(s))  Urinalysis, Routine w reflex microscopic     Status: Abnormal   Collection Time: 08/05/19  6:48 PM  Result Value Ref Range   Color, Urine YELLOW YELLOW   APPearance HAZY (A) CLEAR   Specific Gravity, Urine 1.021 1.005 - 1.030   pH 7.0 5.0 - 8.0   Glucose, UA NEGATIVE NEGATIVE mg/dL   Hgb urine dipstick LARGE (A) NEGATIVE   Bilirubin Urine NEGATIVE NEGATIVE   Ketones, ur NEGATIVE NEGATIVE mg/dL   Protein, ur 30 (A) NEGATIVE mg/dL   Nitrite NEGATIVE NEGATIVE   Leukocytes,Ua TRACE (A) NEGATIVE   RBC / HPF >50 (H) 0 - 5 RBC/hpf   WBC, UA 21-50 0 - 5 WBC/hpf   Bacteria, UA NONE SEEN NONE SEEN   Squamous Epithelial / LPF 0-5 0 - 5   Mucus PRESENT   Pregnancy, urine POC     Status: Abnormal   Collection Time: 08/05/19  6:49 PM  Result Value Ref Range   Preg Test, Ur POSITIVE (A) NEGATIVE  CBC     Status: None   Collection Time: 08/05/19  7:27 PM  Result Value Ref Range   WBC 10.4 4.0 - 10.5 K/uL   RBC 3.94 3.87 - 5.11 MIL/uL   Hemoglobin 12.0 12.0 - 15.0 g/dL   HCT 36.2 36.0 - 46.0 %   MCV 91.9 80.0 - 100.0 fL   MCH 30.5 26.0 -  34.0 pg   MCHC 33.1 30.0 - 36.0 g/dL   RDW 12.8 11.5 - 15.5 %   Platelets 243 150 - 400 K/uL   nRBC 0.0 0.0 - 0.2 %  Comprehensive metabolic panel     Status: Abnormal   Collection Time: 08/05/19  7:27 PM  Result Value Ref Range   Sodium 136 135 - 145 mmol/L   Potassium 3.7 3.5 - 5.1 mmol/L   Chloride 105 98 - 111 mmol/L  CO2 21 (L) 22 - 32 mmol/L   Glucose, Bld 100 (H) 70 - 99 mg/dL   BUN 6 6 - 20 mg/dL   Creatinine, Ser 1.61 0.44 - 1.00 mg/dL   Calcium 8.8 (L) 8.9 - 10.3 mg/dL   Total Protein 6.4 (L) 6.5 - 8.1 g/dL   Albumin 3.4 (L) 3.5 - 5.0 g/dL   AST 14 (L) 15 - 41 U/L   ALT 21 0 - 44 U/L   Alkaline Phosphatase 53 38 - 126 U/L   Total Bilirubin 0.6 0.3 - 1.2 mg/dL   GFR calc non Af Amer >60 >60 mL/min   GFR calc Af Amer >60 >60 mL/min   Anion gap 10 5 - 15  hCG, quantitative, pregnancy     Status: Abnormal   Collection Time: 08/05/19  7:27 PM  Result Value Ref Range   hCG, Beta Chain, Quant, S 865 (H) <5 mIU/mL  Wet prep, genital     Status: Abnormal   Collection Time: 08/05/19  7:39 PM   Specimen: Cervix  Result Value Ref Range   Yeast Wet Prep HPF POC NONE SEEN NONE SEEN   Trich, Wet Prep NONE SEEN NONE SEEN   Clue Cells Wet Prep HPF POC PRESENT (A) NONE SEEN   WBC, Wet Prep HPF POC FEW (A) NONE SEEN   Sperm NONE SEEN    US Ob Transvaginal  Result Date: 07/13/2019 CLINICAL DATA:  23 year old pregnant female presents for assessment of fetal dating and viability. Uncertain LMP. EXAM: TRANSVAGINAL OB ULTRASOUND TECHNIQUE: Transvaginal ultrasound was performed for complete evaluation of the gestation as well as the maternal uterus, adnexal regions, and pelvic cul-de-sac. COMPARISON:  07/03/2019 obstetric scan. FINDINGS: Intrauterine gestational sac: Single irregular intrauterine sac-like structure with minimal heterogeneous internal echoes. Yolk sac:  Not Visualized. Embryo:  Not Visualized. Cardiac Activity: Not Visualized. MSD: 16.8 mm   7 w   2 d Subchorionic  hemorrhage:  None visualized. Maternal uterus/adnexae: Right ovary measures 3.7 x 2.2 x 2.8 cm. Left ovary measures 3.2 x 2.1 x 1.6 cm. No abnormal ovarian or adnexal masses. No abnormal free fluid in the pelvis. Anteverted uterus with no uterine fibroids demonstrated. Small amount of fluid noted in the endocervical canal. IMPRESSION: 1. Single irregular intrauterine sac-like structure with heterogeneous internal echoes, decreased in size since 07/03/2019 scan. Previously demonstrated yolk sac is not visualized on today's scan. No embryo. Findings meet definitive criteria for nonviable pregnancy. This follows SRU consensus guidelines: Diagnostic Criteria for Nonviable Pregnancy Early in the First Trimester. [facility] Mis J Med [phone]. 2. No ovarian or adnexal abnormality. Electronically Signed   By: Delbert Phenix M.D.   On: 07/13/2019 10:21   MAU Course  Procedures  MDM -r/o ectopic -UA: hazy/lg hgb/30PRO/trace leuks, sending urine for culture -CBC: WNL, H/H 12/36.2 -CMP: no abnormalities requiring treatment -hCG: 865 -ABO: B Positive -WetPrep: +ClueCells (isolated finding), otherwise WNL -GC/CT collected -care transferred to Gerrit Heck, CNM, pending [place] results  Orders Placed This Encounter  Procedures  . Wet prep, genital    Standing Status:   Standing    Number of Occurrences:   1  . Culture, OB Urine    Standing Status:   Standing    Number of Occurrences:   1  . US OB LESS THAN 14 WEEKS WITH OB TRANSVAGINAL    Standing Status:   Standing    Number of Occurrences:   1    Order Specific Question:   Symptom/Reason for Exam  Answer:   Vaginal bleeding in pregnancy [[id number]]  . Urinalysis, Routine w reflex microscopic    Standing Status:   Standing    Number of Occurrences:   1  . CBC    Standing Status:   Standing    Number of Occurrences:   1  . Comprehensive metabolic panel    Standing Status:   Standing    Number of Occurrences:   1  . hCG, quantitative, pregnancy     Standing Status:   Standing    Number of Occurrences:   1  . Pregnancy, urine POC    Standing Status:   Standing    Number of Occurrences:   1   No orders of the defined types were placed in this encounter.  Assessment and Plan  [place] Ob Less Than 14 Weeks With Ob Transvaginal  Result Date: 08/05/2019 CLINICAL DATA:  Increased vaginal bleeding history of recent SAB EXAM: OBSTETRIC <14 WK US AND TRANSVAGINAL OB US TECHNIQUE: Both transabdominal and transvaginal ultrasound examinations were performed for complete evaluation of the gestation as well as the maternal uterus, adnexal regions, and pelvic cul-de-sac. Transvaginal technique was performed to assess early pregnancy. COMPARISON:  07/13/2019, 07/03/2019 FINDINGS: Intrauterine gestational sac: Irregular intrauterine fluid collection with scattered internal echoes. This now extends into the lower uterine segment. Increased vascularity is noted. Yolk sac:  Not seen Embryo:  Not seen Cardiac Activity: Not seen Subchorionic hemorrhage:  None visualized. Maternal uterus/adnexae: Ovaries are within normal limits. The left ovary measures 3.5 x 1.5 x 1.6 cm. The right ovary measures 3.2 x 1.8 by 1.4 cm. No significant free fluid. IMPRESSION: 1. Irregular sac like structure present within the uterus extending from fundus to lower uterine segment with thickened vascular endometrium. No visible yolk sac or embryo. Given history of recent SAB, findings raise concern for retained products of conception. Electronically Signed   By: Jasmine PangKim  Fujinaga M.D.   On: 08/05/2019 21:27   Reassessment (9:46 PM) Retained Products   -US reports shows retained products despite cytotec dosing. -In room to discuss results with patient. -Informed that stability of iron level shows no significant lose of blood. -Discussed how expectant management is not a recommended option as she was diagnosed almost a month ago. -Given option for repeat cytotec and office follow up or D&C  procedure.  -Patient states she tried to schedule a D&C at another facility and was denied because of her insurance. -[facility] -Reviewed procedure scheduling and preparation including COVID testing and self-quarantine. -Patient informed that she would be called on Monday or Tuesday with date and time. -Bleeding Precautions Reviewed. -Request for procedure sent. -Encouraged to call or return to MAU if symptoms worsen or with the onset of new symptoms. -Discharged to home in stable condition.   Cherre RobinsJessica L Dejohn Ibarra MSN, CNM Advanced Practice Provider, [facility]

## 2019-08-05 NOTE — Discharge Instructions (Signed)
Dilation and Curettage or Vacuum Curettage ° °Dilation and curettage (D&C) and vacuum curettage are minor procedures. A D&C involves stretching (dilation) the cervix and scraping (curettage) the inside lining of the uterus (endometrium). During a D&C, tissue is gently scraped from the endometrium, starting from the top portion of the uterus down to the lowest part of the uterus (cervix). During a vacuum curettage, the lining and tissue in the uterus are removed with the use of gentle suction. °Curettage may be performed to either diagnose or treat a problem. As a diagnostic procedure, curettage is performed to examine tissues from the uterus. A diagnostic curettage may be done if you have: °· Irregular bleeding in the uterus. °· Bleeding with the development of clots. °· Spotting between menstrual periods. °· Prolonged menstrual periods or other abnormal bleeding. °· Bleeding after menopause. °· No menstrual period (amenorrhea). °· A change in size and shape of the uterus. °· Abnormal endometrial cells discovered during a Pap test. °As a treatment procedure, curettage may be performed for the following reasons: °· Removal of an IUD (intrauterine device). °· Removal of retained placenta after giving birth. °· Abortion. °· Miscarriage. °· Removal of endometrial polyps. °· Removal of uncommon types of noncancerous lumps (fibroids). °Tell a health care provider about: °· Any allergies you have, including allergies to prescribed medicine or latex. °· All medicines you are taking, including vitamins, herbs, eye drops, creams, and over-the-counter medicines. This is especially important if you take any blood-thinning medicine. Bring a list of all of your medicines to your appointment. °· Any problems you or family members have had with anesthetic medicines. °· Any blood disorders you have. °· Any surgeries you have had. °· Your medical history and any medical conditions you have. °· Whether you are pregnant or may be  pregnant. °· Recent vaginal infections you have had. °· Recent menstrual periods, bleeding problems you have had, and what form of birth control (contraception) you use. °What are the risks? °Generally, this is a safe procedure. However, problems may occur, including: °· Infection. °· Heavy vaginal bleeding. °· Allergic reactions to medicines. °· Damage to the cervix or other structures or organs. °· Development of scar tissue (adhesions) inside the uterus, which can cause abnormal amounts of menstrual bleeding. This may make it harder to get pregnant in the future. °· A hole (perforation) or puncture in the uterine wall. This is rare. °What happens before the procedure? °Staying hydrated °Follow instructions from your health care provider about hydration, which may include: °· Up to 2 hours before the procedure - you may continue to drink clear liquids, such as water, clear fruit juice, black coffee, and plain tea. °Eating and drinking restrictions °Follow instructions from your health care provider about eating and drinking, which may include: °· 8 hours before the procedure - stop eating heavy meals or foods such as meat, fried foods, or fatty foods. °· 6 hours before the procedure - stop eating light meals or foods, such as toast or cereal. °· 6 hours before the procedure - stop drinking milk or drinks that contain milk. °· 2 hours before the procedure - stop drinking clear liquids. If your health care provider told you to take your medicine(s) on the day of your procedure, take them with only a sip of water. °Medicines °· Ask your health care provider about: °? Changing or stopping your regular medicines. This is especially important if you are taking diabetes medicines or blood thinners. °? Taking medicines such as aspirin   and ibuprofen. These medicines can thin your blood. Do not take these medicines before your procedure if your health care provider instructs you not to. °· You may be given antibiotic  medicine to help prevent infection. °General instructions °· For 24 hours before your procedure, do not: °? Douche. °? Use tampons. °? Use medicines, creams, or suppositories in the vagina. °? Have sexual intercourse. °· You may be given a pregnancy test on the day of the procedure. °· Plan to have someone take you home from the hospital or clinic. °· You may have a blood or urine sample taken. °· If you will be going home right after the procedure, plan to have someone with you for 24 hours. °What happens during the procedure? °· To reduce your risk of infection: °? Your health care team will wash or sanitize their hands. °? Your skin will be washed with soap. °· An IV tube will be inserted into one of your veins. °· You will be given one of the following: °? A medicine that numbs the area in and around the cervix (local anesthetic). °? A medicine to make you fall asleep (general anesthetic). °· You will lie down on your back, with your feet in foot rests (stirrups). °· The size and position of your uterus will be checked. °· A lubricated instrument (speculum or Sims retractor) will be inserted into the back side of your vagina. The speculum will be used to hold apart the walls of your vagina so your health care provider can see your cervix. °· A tool (tenaculum) will be attached to the lip of the cervix to stabilize it. °· Your cervix will be softened and dilated. This may be done by: °? Taking a medicine. °? Having tapered dilators or thin rods (laminaria) or gradual widening instruments (tapered dilators) inserted into your cervix. °· A small, sharp, curved instrument (curette) will be used to scrape a small amount of tissue or cells from the endometrium or cervical canal. In some cases, gentle suction is applied with the curette. The curette will then be removed. The cells will be taken to a lab for testing. °The procedure may vary among health care providers and hospitals. °What happens after the  procedure? °· You may have mild cramping, backache, pain, and light bleeding or spotting. You may pass small blood clots from your vagina. °· You may have to wear compression stockings. These stockings help to prevent blood clots and reduce swelling in your legs. °· Your blood pressure, heart rate, breathing rate, and blood oxygen level will be monitored until the medicines you were given have worn off. °Summary °· Dilation and curettage (D&C) involves stretching (dilation) the cervix and scraping (curettage) the inside lining of the uterus (endometrium). °· After the procedure, you may have mild cramping, backache, pain, and light bleeding or spotting. You may pass small blood clots from your vagina. °· Plan to have someone take you home from the hospital or clinic. °This information is not intended to replace advice given to you by your health care provider. Make sure you discuss any questions you have with your health care provider. °Document Released: 08/31/2005 Document Revised: 08/13/2017 Document Reviewed: 05/17/2016 °Elsevier Patient Education © 2020 [employer] ° °

## 2019-08-05 NOTE — MAU Note (Signed)
Leah Martin is a 23 y.o. here in MAU reporting: had a a miscarriage at the end of October. Was Rx cytotec and had lots of bleeding with that. The bleeding stopped for 3 days and then restarted 2 days ago. States she is changing a pad every 30 minutes and they are saturated. Also having lower abdominal pain and pressure. Has not taken another UPT.  Onset of complaint: 2 days  Pain score: 8/10  Vitals:   08/05/19 1839  BP: 119/67  Pulse: 98  Resp: 16  Temp: 99.3 F (37.4 C)  SpO2: 100%     Lab orders placed from triage: UPT

## 2019-08-07 ENCOUNTER — Other Ambulatory Visit: Payer: Self-pay | Admitting: Obstetrics and Gynecology

## 2019-08-07 ENCOUNTER — Other Ambulatory Visit (HOSPITAL_COMMUNITY)
Admission: RE | Admit: 2019-08-07 | Discharge: 2019-08-07 | Disposition: A | Discharge status: Home / Self Care | Payer: Medicaid Other | Source: Ambulatory Visit | Attending: Obstetrics and Gynecology | Admitting: Obstetrics and Gynecology

## 2019-08-07 ENCOUNTER — Encounter (HOSPITAL_BASED_OUTPATIENT_CLINIC_OR_DEPARTMENT_OTHER): Payer: Self-pay | Admitting: *Deleted

## 2019-08-07 ENCOUNTER — Other Ambulatory Visit: Payer: Self-pay

## 2019-08-07 ENCOUNTER — Other Ambulatory Visit (HOSPITAL_COMMUNITY): Payer: Self-pay | Admitting: Obstetrics and Gynecology

## 2019-08-07 DIAGNOSIS — Z20828 Contact with and (suspected) exposure to other viral communicable diseases: Secondary | ICD-10-CM | POA: Insufficient documentation

## 2019-08-07 DIAGNOSIS — O021 Missed abortion: Secondary | ICD-10-CM

## 2019-08-07 DIAGNOSIS — Z01812 Encounter for preprocedural laboratory examination: Secondary | ICD-10-CM | POA: Insufficient documentation

## 2019-08-07 LAB — CULTURE, OB URINE

## 2019-08-07 LAB — GC/CHLAMYDIA PROBE AMP (~~LOC~~) NOT AT ARMC
Chlamydia: NEGATIVE
Comment: NEGATIVE
Comment: NORMAL
Neisseria Gonorrhea: NEGATIVE

## 2019-08-07 LAB — SARS CORONAVIRUS 2 (TAT 6-24 HRS): SARS Coronavirus 2: NEGATIVE

## 2019-08-08 ENCOUNTER — Encounter (HOSPITAL_COMMUNITY): Payer: Self-pay | Admitting: Certified Registered Nurse Anesthetist

## 2019-08-09 ENCOUNTER — Telehealth: Payer: Self-pay

## 2019-08-09 ENCOUNTER — Encounter (HOSPITAL_COMMUNITY): Payer: Self-pay | Admitting: *Deleted

## 2019-08-09 ENCOUNTER — Telehealth: Payer: Self-pay | Admitting: Obstetrics and Gynecology

## 2019-08-09 ENCOUNTER — Ambulatory Visit (HOSPITAL_COMMUNITY): Payer: Medicaid Other

## 2019-08-09 ENCOUNTER — Other Ambulatory Visit: Payer: Self-pay

## 2019-08-09 ENCOUNTER — Encounter (HOSPITAL_COMMUNITY)
Admission: AD | Disposition: A | Discharge status: Home / Self Care | Payer: Self-pay | Source: Home / Self Care | Attending: Obstetrics and Gynecology

## 2019-08-09 ENCOUNTER — Inpatient Hospital Stay (HOSPITAL_COMMUNITY): Payer: Self-pay | Admitting: Certified Registered Nurse Anesthetist

## 2019-08-09 ENCOUNTER — Inpatient Hospital Stay (HOSPITAL_COMMUNITY)
Admission: AD | Admit: 2019-08-09 | Discharge: 2019-08-09 | Disposition: A | Discharge status: Home / Self Care | Payer: Self-pay | Attending: Obstetrics and Gynecology | Admitting: Obstetrics and Gynecology

## 2019-08-09 ENCOUNTER — Inpatient Hospital Stay (HOSPITAL_COMMUNITY): Payer: Self-pay

## 2019-08-09 ENCOUNTER — Ambulatory Visit (HOSPITAL_BASED_OUTPATIENT_CLINIC_OR_DEPARTMENT_OTHER)
Admission: RE | Admit: 2019-08-09 | Payer: Medicaid Other | Source: Home / Self Care | Admitting: Obstetrics and Gynecology

## 2019-08-09 DIAGNOSIS — O034 Incomplete spontaneous abortion without complication: Secondary | ICD-10-CM | POA: Diagnosis present

## 2019-08-09 DIAGNOSIS — O021 Missed abortion: Secondary | ICD-10-CM

## 2019-08-09 DIAGNOSIS — Z369 Encounter for antenatal screening, unspecified: Secondary | ICD-10-CM | POA: Insufficient documentation

## 2019-08-09 DIAGNOSIS — R1110 Vomiting, unspecified: Secondary | ICD-10-CM | POA: Insufficient documentation

## 2019-08-09 DIAGNOSIS — N939 Abnormal uterine and vaginal bleeding, unspecified: Secondary | ICD-10-CM | POA: Insufficient documentation

## 2019-08-09 DIAGNOSIS — R1030 Lower abdominal pain, unspecified: Secondary | ICD-10-CM | POA: Insufficient documentation

## 2019-08-09 DIAGNOSIS — R509 Fever, unspecified: Secondary | ICD-10-CM | POA: Diagnosis present

## 2019-08-09 DIAGNOSIS — M791 Myalgia, unspecified site: Secondary | ICD-10-CM | POA: Insufficient documentation

## 2019-08-09 HISTORY — DX: Other specified health status: Z78.9

## 2019-08-09 HISTORY — PX: DILATION AND CURETTAGE OF UTERUS: SHX78

## 2019-08-09 LAB — URINALYSIS, ROUTINE W REFLEX MICROSCOPIC
Bilirubin Urine: NEGATIVE
Glucose, UA: NEGATIVE mg/dL
Ketones, ur: 20 mg/dL — AB
Nitrite: NEGATIVE
Protein, ur: 100 mg/dL — AB
RBC / HPF: >50 RBC/hpf — ABNORMAL HIGH (ref 0–5)
Specific Gravity, Urine: 1.038 — ABNORMAL HIGH (ref 1.005–1.030)
WBC, UA: >50 WBC/hpf — ABNORMAL HIGH (ref 0–5)
pH: 5 (ref 5.0–8.0)

## 2019-08-09 LAB — CBC WITH DIFFERENTIAL/PLATELET
Abs Immature Granulocytes: 0.06 10*3/uL (ref 0.00–0.07)
Basophils Absolute: 0 10*3/uL (ref 0.0–0.1)
Basophils Relative: 0 %
Eosinophils Absolute: 0 10*3/uL (ref 0.0–0.5)
Eosinophils Relative: 0 %
HCT: 33.7 % — ABNORMAL LOW (ref 36.0–46.0)
Hemoglobin: 11.4 g/dL — ABNORMAL LOW (ref 12.0–15.0)
Immature Granulocytes: 1 %
Lymphocytes Relative: 6 %
Lymphs Abs: 0.7 10*3/uL (ref 0.7–4.0)
MCH: 30.6 pg (ref 26.0–34.0)
MCHC: 33.8 g/dL (ref 30.0–36.0)
MCV: 90.6 fL (ref 80.0–100.0)
Monocytes Absolute: 1.2 10*3/uL — ABNORMAL HIGH (ref 0.1–1.0)
Monocytes Relative: 10 %
Neutro Abs: 9.5 10*3/uL — ABNORMAL HIGH (ref 1.7–7.7)
Neutrophils Relative %: 83 %
Platelets: 227 10*3/uL (ref 150–400)
RBC: 3.72 MIL/uL — ABNORMAL LOW (ref 3.87–5.11)
RDW: 12.5 % (ref 11.5–15.5)
WBC: 11.5 10*3/uL — ABNORMAL HIGH (ref 4.0–10.5)
nRBC: 0 % (ref 0.0–0.2)

## 2019-08-09 LAB — TYPE AND SCREEN
ABO/RH(D): B POS
Antibody Screen: NEGATIVE

## 2019-08-09 LAB — HCG, QUANTITATIVE, PREGNANCY: hCG, Beta Chain, Quant, S: 320 m[IU]/mL — ABNORMAL HIGH (ref <5)

## 2019-08-09 LAB — ABO/RH: ABO/RH(D): B POS

## 2019-08-09 SURGERY — DILATION AND EVACUATION, UTERUS
Anesthesia: Choice

## 2019-08-09 SURGERY — DILATION AND CURETTAGE
Anesthesia: General

## 2019-08-09 MED ORDER — DOCUSATE SODIUM 100 MG PO CAPS: 100.0000 mg | ORAL_CAPSULE | Freq: Two times a day (BID) | ORAL | 2 refills | Status: DC | PRN

## 2019-08-09 MED ORDER — MIDAZOLAM HCL 2 MG/2ML IJ SOLN
INTRAMUSCULAR | Status: DC | PRN
  Administered 2019-08-09: 2 mg via INTRAVENOUS

## 2019-08-09 MED ORDER — FENTANYL CITRATE (PF) 250 MCG/5ML IJ SOLN
INTRAMUSCULAR | Status: AC
  Filled 2019-08-09: qty 5

## 2019-08-09 MED ORDER — METHYLERGONOVINE MALEATE 0.2 MG/ML IJ SOLN
INTRAMUSCULAR | Status: DC | PRN
  Administered 2019-08-09: 0.2 mg via INTRAMUSCULAR

## 2019-08-09 MED ORDER — OXYCODONE-ACETAMINOPHEN 5-325 MG PO TABS: 1.0000 | ORAL_TABLET | ORAL | 0 refills | Status: DC | PRN

## 2019-08-09 MED ORDER — ONDANSETRON HCL 4 MG/2ML IJ SOLN
INTRAMUSCULAR | Status: DC | PRN
  Administered 2019-08-09: 4 mg via INTRAVENOUS

## 2019-08-09 MED ORDER — DEXAMETHASONE SODIUM PHOSPHATE 10 MG/ML IJ SOLN
INTRAMUSCULAR | Status: DC | PRN
  Administered 2019-08-09: 5 mg via INTRAVENOUS

## 2019-08-09 MED ORDER — PROMETHAZINE HCL 25 MG/ML IJ SOLN: 6.2500 mg | INTRAMUSCULAR | Status: DC | PRN

## 2019-08-09 MED ORDER — LACTATED RINGERS IV SOLN
INTRAVENOUS | Status: DC
  Administered 2019-08-09: 11:00:00 via INTRAVENOUS

## 2019-08-09 MED ORDER — IBUPROFEN 600 MG PO TABS: 600.0000 mg | ORAL_TABLET | Freq: Four times a day (QID) | ORAL | 2 refills | Status: DC | PRN

## 2019-08-09 MED ORDER — PROPOFOL 10 MG/ML IV BOLUS
INTRAVENOUS | Status: DC | PRN
  Administered 2019-08-09: 200 mg via INTRAVENOUS

## 2019-08-09 MED ORDER — FENTANYL CITRATE (PF) 250 MCG/5ML IJ SOLN
INTRAMUSCULAR | Status: DC | PRN
  Administered 2019-08-09 (×3): 50 ug via INTRAVENOUS

## 2019-08-09 MED ORDER — LIDOCAINE 2% (20 MG/ML) 5 ML SYRINGE
INTRAMUSCULAR | Status: DC | PRN
  Administered 2019-08-09: 60 mg via INTRAVENOUS

## 2019-08-09 MED ORDER — DOXYCYCLINE HYCLATE 100 MG IV SOLR
200.0000 mg | INTRAVENOUS | Status: AC
  Administered 2019-08-09: 200 mg via INTRAVENOUS
  Filled 2019-08-09: qty 200

## 2019-08-09 MED ORDER — FENTANYL CITRATE (PF) 100 MCG/2ML IJ SOLN: 25.0000 ug | INTRAMUSCULAR | Status: DC | PRN

## 2019-08-09 MED ORDER — PROPOFOL 10 MG/ML IV BOLUS
INTRAVENOUS | Status: AC
  Filled 2019-08-09: qty 20

## 2019-08-09 MED ORDER — MIDAZOLAM HCL 2 MG/2ML IJ SOLN
INTRAMUSCULAR | Status: AC
  Filled 2019-08-09: qty 2

## 2019-08-09 SURGICAL SUPPLY — 16 items
CATH ROBINSON RED A/P 16FR (CATHETERS) ×3 IMPLANT
DECANTER SPIKE VIAL GLASS SM (MISCELLANEOUS) ×3 IMPLANT
GLOVE BIOGEL PI IND STRL 6.5 (GLOVE) ×1 IMPLANT
GLOVE BIOGEL PI IND STRL 7.0 (GLOVE) ×1 IMPLANT
GLOVE BIOGEL PI INDICATOR 6.5 (GLOVE) ×2
GLOVE BIOGEL PI INDICATOR 7.0 (GLOVE) ×2
GLOVE ORTHOPEDIC STR SZ6.5 (GLOVE) ×3 IMPLANT
GOWN STRL REUS W/ TWL LRG LVL3 (GOWN DISPOSABLE) ×2 IMPLANT
GOWN STRL REUS W/TWL LRG LVL3 (GOWN DISPOSABLE) ×4
KIT TURNOVER KIT B (KITS) ×3 IMPLANT
NS IRRIG 1000ML POUR BTL (IV SOLUTION) ×3 IMPLANT
PACK VAGINAL MINOR WOMEN LF (CUSTOM PROCEDURE TRAY) ×3 IMPLANT
PAD OB MATERNITY 4.3X12.25 (PERSONAL CARE ITEMS) ×3 IMPLANT
TOWEL GREEN STERILE FF (TOWEL DISPOSABLE) ×6 IMPLANT
UNDERPAD 30X36 HEAVY ABSORB (UNDERPADS AND DIAPERS) ×3 IMPLANT
VACURETTE 8 RIGID CVD (CANNULA) ×3 IMPLANT

## 2019-08-09 NOTE — MAU Note (Addendum)
Presents with c/o fever x3 days.  Reports scheduled for D&C this morning secondary missed abortion after taking Cytotec on July 26, 2019.  States was instructed to be seen in MAU secondary fever.  Reports took Tylenol x3 @ 0600.

## 2019-08-09 NOTE — Anesthesia Preprocedure Evaluation (Signed)
Anesthesia Evaluation  Patient identified by MRN, date of birth, ID band Patient awake    Reviewed: Allergy & Precautions, NPO status , Patient's Chart, lab work & pertinent test results  Airway Mallampati: II  TM Distance: >3 FB     Dental  (+) Dental Advisory Given   Pulmonary neg pulmonary ROS,    breath sounds clear to auscultation       Cardiovascular negative cardio ROS   Rhythm:Regular Rate:Normal     Neuro/Psych negative neurological ROS     GI/Hepatic negative GI ROS, Neg liver ROS,   Endo/Other  negative endocrine ROS  Renal/GU negative Renal ROS     Musculoskeletal   Abdominal   Peds  Hematology negative hematology ROS (+)   Anesthesia Other Findings   Reproductive/Obstetrics                             Anesthesia Physical Anesthesia Plan  ASA: II  Anesthesia Plan: General   Post-op Pain Management:    Induction: Intravenous  PONV Risk Score and Plan: 3 and Dexamethasone, Ondansetron and Treatment may vary due to age or medical condition  Airway Management Planned: LMA  Additional Equipment:   Intra-op Plan:   Post-operative Plan: Extubation in OR  Informed Consent: I have reviewed the patients History and Physical, chart, labs and discussed the procedure including the risks, benefits and alternatives for the proposed anesthesia with the patient or authorized representative who has indicated his/her understanding and acceptance.     Dental advisory given  Plan Discussed with: CRNA  Anesthesia Plan Comments:         Anesthesia Quick Evaluation

## 2019-08-09 NOTE — Telephone Encounter (Addendum)
GYN Telephone Note I was informed that her case for incomplete miscarriage was cancelled for this morning b/c she stated that she had fevers and what sounds like respiratory s/s. Patient called at [phone] and generic VM came on. I asked her to please call the office and if she isn't able to get in contact with the office to go to the ED for evaluation.  Concern is that patient may have fever from retained products of conception, and not covid or some other respiratory cause, and still may need a d&c, so she needs to be evaluated for this, too  If patient calls the office, please advise her to go to [facility] for evaluation. In the ED, please evaluate for retained products as etiology of her fever  In basket message sent to office to have them try and contact patient.  Durene Romans MD Attending Center for [employer] (Faculty Practice) 08/09/2019 Time: [phone]

## 2019-08-09 NOTE — MAU Provider Note (Signed)
Chief Complaint: Fever   First Provider Initiated Contact with Patient 08/09/19 0800     SUBJECTIVE HPI: Leah Martin is a 23 y.o. G1P0010 at Unknown who presents to Maternity Admissions reporting fever. Diagnosed with a  Miscarriage earlier this month. Took cytotec 11/11. Returned to MAU on Saturday due to abdominal pain & was diagnosed with retained products of conception & was subsequently scheduled for a D&C this morning.  Reports passing a large blood clot Saturday night. Since then bleeding has decreased & she is not saturating pads but continues to pass small pink/brown clots. Has had fever/chills since Saturday evening. Highest temp was this morning, 103.8. Has been taking tylenol for fevers.  Denies sore throat, cough, SOB - had a negative COVID swab 2 days ago.  Vomited twice since last night but denies nausea. Continues to have some lower abdominal cramping since last Friday.  Denies diarrhea or dysuria.   Location: abdomen Quality: cramping Severity: 0/10 on pain scale Duration: since Friday Timing: intermittent Modifying factors: none Associated signs and symptoms: vaginal bleeding & fever  Past Medical History:  Diagnosis Date  . Medical history non-contributory    OB History  Gravida Para Term Preterm AB Living  1       1    SAB TAB Ectopic Multiple Live Births  1            # Outcome Date GA Lbr Len/2nd Weight Sex Delivery Anes PTL Lv  1 SAB            Past Surgical History:  Procedure Laterality Date  . NO PAST SURGERIES     Social History   Socioeconomic History  . Marital status: Single    Spouse name: Not on file  . Number of children: Not on file  . Years of education: Not on file  . Highest education level: Not on file  Occupational History  . Not on file  Social Needs  . Financial resource strain: Not on file  . Food insecurity    Worry: Not on file    Inability: Not on file  . Transportation needs    Medical: Not on file     Non-medical: Not on file  Tobacco Use  . Smoking status: Never Smoker  . Smokeless tobacco: Never Used  Substance and Sexual Activity  . Alcohol use: No  . Drug use: No  . Sexual activity: Not on file  Lifestyle  . Physical activity    Days per week: Not on file    Minutes per session: Not on file  . Stress: Not on file  Relationships  . Social Musician on phone: Not on file    Gets together: Not on file    Attends religious service: Not on file    Active member of club or organization: Not on file    Attends meetings of clubs or organizations: Not on file    Relationship status: Not on file  . Intimate partner violence    Fear of current or ex partner: Not on file    Emotionally abused: Not on file    Physically abused: Not on file    Forced sexual activity: Not on file  Other Topics Concern  . Not on file  Social History Narrative  . Not on file   Family History  Problem Relation Age of Onset  . Kidney disease Mother    No current facility-administered medications on file prior to encounter.  Current Outpatient Medications on File Prior to Encounter  Medication Sig Dispense Refill  . acetaminophen (TYLENOL) 325 MG tablet Take 975 mg by mouth every 6 (six) hours as needed.    Marland Kitchen. ibuprofen (ADVIL) 800 MG tablet Take 1 tablet (800 mg total) by mouth every 8 (eight) hours as needed. 60 tablet 1   No Known Allergies  I have reviewed patient's Past Medical Hx, Surgical Hx, Family Hx, Social Hx, medications and allergies.   Review of Systems  Constitutional: Positive for chills and fever.  HENT: Negative.   Respiratory: Negative.   Cardiovascular: Negative.   Gastrointestinal: Positive for abdominal pain and vomiting. Negative for constipation, diarrhea and nausea.  Genitourinary: Positive for vaginal bleeding. Negative for dysuria and vaginal discharge.  Musculoskeletal: Positive for myalgias.    OBJECTIVE Patient Vitals for the past 24 hrs:  BP Temp  Temp src Pulse Resp SpO2 Height Weight  08/09/19 1005 - 98.1 F (36.7 C) Oral - - - - -  08/09/19 0741 (!) 107/56 99 F (37.2 C) Oral 100 18 96 % - -  08/09/19 0740 - - - - - - 5\' 2"  (1.575 m) 64.4 kg   Constitutional: Well-developed, well-nourished female in no acute distress.  Cardiovascular: normal rate & rhythm, no murmur Respiratory: normal rate and effort. Lung sounds clear throughout GI: Abd soft, non-tender, Pos BS x 4. No guarding or rebound tenderness MS: Extremities nontender, no edema, normal ROM Neurologic: Alert and oriented x 4.      LAB RESULTS Results for orders placed or performed during the hospital encounter of 08/09/19 (from the past 24 hour(s))  CBC with Differential     Status: Abnormal   Collection Time: 08/09/19  8:04 AM  Result Value Ref Range   WBC 11.5 (H) 4.0 - 10.5 K/uL   RBC 3.72 (L) 3.87 - 5.11 MIL/uL   Hemoglobin 11.4 (L) 12.0 - 15.0 g/dL   HCT 45.433.7 (L) 09.836.0 - 11.946.0 %   MCV 90.6 80.0 - 100.0 fL   MCH 30.6 26.0 - 34.0 pg   MCHC 33.8 30.0 - 36.0 g/dL   RDW 14.712.5 82.911.5 - 56.215.5 %   Platelets 227 150 - 400 K/uL   nRBC 0.0 0.0 - 0.2 %   Neutrophils Relative % 83 %   Neutro Abs 9.5 (H) 1.7 - 7.7 K/uL   Lymphocytes Relative 6 %   Lymphs Abs 0.7 0.7 - 4.0 K/uL   Monocytes Relative 10 %   Monocytes Absolute 1.2 (H) 0.1 - 1.0 K/uL   Eosinophils Relative 0 %   Eosinophils Absolute 0.0 0.0 - 0.5 K/uL   Basophils Relative 0 %   Basophils Absolute 0.0 0.0 - 0.1 K/uL   Immature Granulocytes 1 %   Abs Immature Granulocytes 0.06 0.00 - 0.07 K/uL  Type and screen     Status: None   Collection Time: 08/09/19  8:04 AM  Result Value Ref Range   ABO/RH(D) B POS    Antibody Screen NEG    Sample Expiration      08/12/2019,2359 Performed at [facility], [address] [place], [place] [address]   hCG, quantitative, pregnancy     Status: Abnormal   Collection Time: 08/09/19  8:04 AM  Result Value Ref Range   hCG, Beta Chain, Quant, S 320 (H) <5 mIU/mL     IMAGING [place] Ob Transvaginal  Result Date: 08/09/2019 CLINICAL DATA:  Follow-up recent spontaneous abortion. Fever and chills for 3 days. EXAM: TRANSVAGINAL OB  ULTRASOUND TECHNIQUE: Transvaginal ultrasound was performed for complete evaluation of the gestation as well as the maternal uterus, adnexal regions, and pelvic cul-de-sac. COMPARISON:  08/05/2019 FINDINGS: Intrauterine gestational sac: No normal sac visualized Maternal uterus/adnexae: A small amount of fluid persists in the endometrial cavity with diffuse thickening of the endometrium, which shows no significant change compared with prior study. Both ovaries are normal appearance. No adnexal mass or abnormal free fluid identified. IMPRESSION: Diffuse endometrial thickening and small amount of fluid appears similar to previous study, and likely due to retained products of conception. Electronically Signed   By: Marlaine Hind M.D.   On: 08/09/2019 10:09    MAU COURSE Orders Placed This Encounter  Procedures  . US OB Transvaginal  . CBC with Differential  . hCG, quantitative, pregnancy  . Urinalysis, Routine w reflex microscopic  . Diet NPO time specified  . Type and screen  . ABO/Rh  . Insert peripheral IV   Meds ordered this encounter  Medications  . lactated ringers infusion    MDM Pt afebrile in MAU; took 3 RS tylenol this morning prior to arrival.  Denies respiratory complaints.  Abdomen non tender  CBC with elevated WBC. Ultrasound continues to shows retained POCs Discussed with Dr. Rosana Hoes who will come speak with patient regarding plan for D&C.   ASSESSMENT 1. Retained products of conception, early pregnancy     PLAN Dr. Rosana Hoes on unit to speak with patient   Jorje Guild, NP 08/09/2019  10:59 AM

## 2019-08-09 NOTE — Op Note (Addendum)
Sherilyn Cooter PROCEDURE DATE:  08/09/2019  PREOPERATIVE DIAGNOSIS: retained products of conception POSTOPERATIVE DIAGNOSIS: The same PROCEDURE: suction dilation and curettage under ultrasound guidance SURGEON:  Dr. Baldemar Lenis  INDICATIONS: 23 y.o.  G1P0010 presenting with bleeding s/p incomplete abortion with retained products of conception, recommended for surgical management.  Risks of surgery were discussed with the patient including but not limited to: bleeding which may require transfusion; infection which may require antibiotics; injury to uterus or surrounding organs; need for additional procedures including laparotomy or laparoscopy; possibility of intrauterine scarring which may impair future fertility; and other postoperative/anesthesia complications. Written informed consent was obtained.  ABO, Rh: --/--/B POS, B POS Performed at [facility], [address], [place], [place] [address]  [phone]) CBC    Component Value Date/Time   WBC 11.5 (H) 08/09/2019 0804   RBC 3.72 (L) 08/09/2019 0804   HGB 11.4 (L) 08/09/2019 0804   HCT 33.7 (L) 08/09/2019 0804   PLT 227 08/09/2019 0804   MCV 90.6 08/09/2019 0804   MCH 30.6 08/09/2019 0804   MCHC 33.8 08/09/2019 0804   RDW 12.5 08/09/2019 0804   LYMPHSABS 0.7 08/09/2019 0804   MONOABS 1.2 (H) 08/09/2019 0804   EOSABS 0.0 08/09/2019 0804   BASOSABS 0.0 08/09/2019 0804     FINDINGS:   Retained products of conception within uterus, thickened tissue. Empty endometrial stripe noted on ultrasound at the end of the procedure.   ANESTHESIA:   General anesthesia INTRAVENOUS FLUIDS:  500 mL of LR ESTIMATED BLOOD LOSS:  100 mL URINE OUTPUT: 400 mL clear yellow urine SPECIMENS:  Products of conception sent to pathology COMPLICATIONS:  None immediate.  PROCEDURE DETAILS:  The patient received intravenous Doxycycline while in the preoperative area.  She was then taken to the operating room where anesthesia was administered  and was found to be adequate.  After an adequate timeout was performed, she was placed in the dorsal lithotomy position and examined; then prepped and draped in the sterile manner. Her bladder was catheterized for return of clear, yellow urine. A vaginal speculum was then placed in the patient's vagina and a single tooth tenaculum was applied to the anterior lip of the cervix.  The cervix was gently dilated under ultrasound guidance to accommodate a 8 mm suction curette. The suction curette was gently advanced to the uterine fundus and activated. The curette was slowly rotated to clear the uterus of products of conception.  This was repeated until the endometrial cavity was cleared and a clean stripe was noted on ultrasound. A sharp curettage was then performed to confirm complete emptying of the uterus. The suction curette was advanced to the fundus and activated one last time under ultrasound guidance and removed and the procedure was finished. There was an empty endometrial stripe noted on the ultrasound at the end of the curettage. There was a small amount of brisk bleeding noted at the end of the procedure which improved with massage of uterus, and the tenaculum removed with good hemostasis noted at the tenaculum site with application of pressure. Methergine given for additional hemostasis.  All instruments were removed from the patient's vagina.  Sponge and instrument counts were correct times three.  The patient tolerated the procedure well and was taken to the recovery area awake, extubated and in stable condition.  The patient will be discharged to home as per PACU criteria.  Routine postoperative instructions given.  She was prescribed Percocet, Ibuprofen and Colace.  She will  follow up in the clinic in 2-3 weeks for postoperative evaluation.   Feliz Beam, M.D. Attending Center for [employer] Fish farm manager)

## 2019-08-09 NOTE — H&P (Signed)
OB/GYN Pre-Op History and Physical  Leah Martin is a 23 y.o. G1P0010 who presents to MAU. Patient was scheduled for [facility] today for retained products, however reports her surgery was cancelled yesterday due to patient complaints of fever and questionable respiratory symptoms. Presents to MAU per RN for fever.  Originally diagnosed with missed abortion 07/13/19, took cytotec for same on 07/26/19. Had some bleeding and clots come out, bled until 08/05/19, at which point she had heavier bleeding and pelvic pain/pressure so she presented to MAU. Diagnosed with retained products of conception and opted for [facility], was scheduled originally for today.   Today, patient reports she has had fever off and on since Saturday night. Also had abdominal pain/pressure since Saturday night although it is better today. Reports fevers as high as 103 by oral thermometer this am. Took tylenol 1 gm this am with sip of water. Still having bleeding, denies foul smelling discharge or significant pain.  Denies any shortness of breath, difficulty breathing. Denies loss of sense of taste or smell. Denies cough, congestion or any other respiratory symptoms. Does report mild pain with very deep inspiration but otherwise, aside from fevers, feeling well.      Past Medical History:  Diagnosis Date  . Medical history non-contributory     Past Surgical History:  Procedure Laterality Date  . NO PAST SURGERIES      OB History  Gravida Para Term Preterm AB Living  1       1    SAB TAB Ectopic Multiple Live Births  1            # Outcome Date GA Lbr Len/2nd Weight Sex Delivery Anes PTL Lv  1 SAB             Social History   Socioeconomic History  . Marital status: Single    Spouse name: Not on file  . Number of children: Not on file  . Years of education: Not on file  . Highest education level: Not on file  Occupational History  . Not on file  Social Needs  . Financial resource strain: Not on file  . Food  insecurity    Worry: Not on file    Inability: Not on file  . Transportation needs    Medical: Not on file    Non-medical: Not on file  Tobacco Use  . Smoking status: Never Smoker  . Smokeless tobacco: Never Used  Substance and Sexual Activity  . Alcohol use: No  . Drug use: No  . Sexual activity: Not on file  Lifestyle  . Physical activity    Days per week: Not on file    Minutes per session: Not on file  . Stress: Not on file  Relationships  . Social Herbalist on phone: Not on file    Gets together: Not on file    Attends religious service: Not on file    Active member of club or organization: Not on file    Attends meetings of clubs or organizations: Not on file    Relationship status: Not on file  Other Topics Concern  . Not on file  Social History Narrative  . Not on file    Family History  Problem Relation Age of Onset  . Kidney disease Mother     Medications Prior to Admission  Medication Sig Dispense Refill Last Dose  . acetaminophen (TYLENOL) 325 MG tablet Take 975 mg by mouth every 6 (six) hours  as needed.   08/09/2019 at 0600  . ibuprofen (ADVIL) 800 MG tablet Take 1 tablet (800 mg total) by mouth every 8 (eight) hours as needed. 60 tablet 1 08/05/2019    No Known Allergies  Review of Systems: Negative except for what is mentioned in HPI.     Physical Exam: BP (!) 107/56 (BP Location: Right Arm)   Pulse 100   Temp 98.1 F (36.7 C) (Oral)   Resp 18   Ht 5\' 2"  (1.575 m)   Wt 64.4 kg   SpO2 96%   BMI 25.95 kg/m  CONSTITUTIONAL: Well-developed, well-nourished female in no acute distress.  HENT:  Normocephalic, atraumatic, External right and left ear normal. Oropharynx is clear and moist EYES: Conjunctivae and EOM are normal. Pupils are equal, round, and reactive to light. No scleral icterus.  NECK: Normal range of motion, supple, no masses SKIN: Skin is warm and dry. No rash noted. Not diaphoretic. No erythema. No pallor. NEUROLGIC:  Alert and oriented to person, place, and time. Normal reflexes, muscle tone coordination. No cranial nerve deficit noted. PSYCHIATRIC: Normal mood and affect. Normal behavior. Normal judgment and thought content. CARDIOVASCULAR: Normal heart rate noted RESPIRATORY: Effort normal, no problems with respiration noted ABDOMEN: Soft, very mildly tender in lower quadrants, no abject uterine tenderness PELVIC: Deferred MUSCULOSKELETAL: Normal range of motion. No edema and no tenderness. 2+ distal pulses.   Pertinent Labs/Studies:   Results for orders placed or performed during the hospital encounter of 08/09/19 (from the past 72 hour(s))  CBC with Differential     Status: Abnormal   Collection Time: 08/09/19  8:04 AM  Result Value Ref Range   WBC 11.5 (H) 4.0 - 10.5 K/uL   RBC 3.72 (L) 3.87 - 5.11 MIL/uL   Hemoglobin 11.4 (L) 12.0 - 15.0 g/dL   HCT 08/11/19 (L) 29.5 - 62.1 %   MCV 90.6 80.0 - 100.0 fL   MCH 30.6 26.0 - 34.0 pg   MCHC 33.8 30.0 - 36.0 g/dL   RDW 30.8 65.7 - 84.6 %   Platelets 227 150 - 400 K/uL   nRBC 0.0 0.0 - 0.2 %   Neutrophils Relative % 83 %   Neutro Abs 9.5 (H) 1.7 - 7.7 K/uL   Lymphocytes Relative 6 %   Lymphs Abs 0.7 0.7 - 4.0 K/uL   Monocytes Relative 10 %   Monocytes Absolute 1.2 (H) 0.1 - 1.0 K/uL   Eosinophils Relative 0 %   Eosinophils Absolute 0.0 0.0 - 0.5 K/uL   Basophils Relative 0 %   Basophils Absolute 0.0 0.0 - 0.1 K/uL   Immature Granulocytes 1 %   Abs Immature Granulocytes 0.06 0.00 - 0.07 K/uL    Comment: Performed at [facility], [address] [place], [place]  Type and screen     Status: None   Collection Time: 08/09/19  8:04 AM  Result Value Ref Range   ABO/RH(D) B POS    Antibody Screen NEG    Sample Expiration      08/12/2019,2359 Performed at [facility], [address], [place], [place]   hCG, quantitative, pregnancy     Status: Abnormal   Collection Time: 08/09/19  8:04 AM  Result Value Ref Range    hCG, Beta Chain, Quant, S 320 (H) <5 mIU/mL    Comment:          GEST. AGE      CONC.  (mIU/mL)   <=1 WEEK  5 - 50     2 WEEKS       50 - 500     3 WEEKS       100 - 10,000     4 WEEKS     1,000 - 30,000     5 WEEKS     3,500 - 115,000   6-8 WEEKS     12,000 - 270,000    12 WEEKS     15,000 - 220,000        FEMALE AND NON-PREGNANT FEMALE:     LESS THAN 5 mIU/mL Performed at [facility], [address] [place], [place] [address]   ABO/Rh     Status: None   Collection Time: 08/09/19  8:04 AM  Result Value Ref Range   ABO/RH(D)      B POS Performed at [facility], [address] [place], [place] [address]        Assessment and Plan :Sherilyn Cootersabella J Lubas is a 23 y.o. G1P0010 here for fevers in the setting of retained products of conception. Scheduled D&C cancelled today which was cancelled yesterday, for fever and questionable respiratory symptoms on pre-op screen, however on interview today, patient denies all respiratory symptoms. Reports fever at home up to 103, took tylenol and remains 99 degrees here. Had negative pre-procedure COVID test. Mildly tender lower abdomen with ongoing bleeding from retained products.  Reviewed symptoms with patient, with no respiratory symptoms, hesitant to ascribe fever to COVID or other respiratory illness. Given retained POCs, concern for infected retained products as source of fever. If infected retained POCs, recommend proceeding with suction D&C today. Reviewed risks/benefits of the above scenarios with patient, risk of becoming septic if she does in fact, have infected POCs and D&C is delayed, reviewed risks of proceeding with procedure in the setting of respiratory illness. Recommended proceeding with suction D&C, patient is agreeable.  The risks of suction D&C were reviewed with the patient; including but not limited to: infection which may require antibiotics; bleeding which may require transfusion or re-operation; injury to  bowel, bladder, ureters or other surrounding organs; need for additional procedures including hysterectomy in the event of a life-threatening hemorrhage; placental abnormalities wth subsequent pregnancies, thromboembolic phenomenon and other postoperative/anesthesia complications. Reviewed that all sampling will be sent to pathology and further management based on findings. The patient concurred with the proposed plan, giving informed consent for the procedure. She is agreeable to a blood transfusion in the event of emergency.   Patient is NPO Anesthesia aware Preoperative prophylactic antibiotics ordered SCDs  Admission labs To OR when ready Neg COVID test 08/07/19    K. Therese SarahMeryl Fatou Dunnigan, M.D. Attending Obstetrician & Gynecologist, [facility] Center for [facility], [facility]

## 2019-08-09 NOTE — Progress Notes (Signed)
Pt to OR via stretcher in stable condition. 

## 2019-08-09 NOTE — Telephone Encounter (Signed)
Called pt per request of Dr. Ilda Basset for pt to go to ER for questionable infection due to products of conception r/t pt reporting a fever.  Per chart review, pt is currently getting an [place].  Called pt and I verfied with pt if she was in the office getting an [place].  Pt stated yes and that they checked her temp and it was 98.1 orally.  Notified Dr. Ilda Basset who informed me that pt must be in the ER and not the office for [place] and that because pt is in the hospital that there is no more further recommendations.  Called pt and stated that she must be in the hospital for the [place].  Pt reports that she went to the ER because of her fever and they are doing her [place].  I asked pt if she had direct questions for me, even though the hospital will take good care of her now.  Pt verbalized no.    Mel Almond, RN 08/09/19

## 2019-08-09 NOTE — Anesthesia Postprocedure Evaluation (Signed)
Anesthesia Post Note  Patient: Leah Martin  Procedure(s) Performed: SUCTION DILATATION AND CURETTAGE (N/A )     Patient location during evaluation: PACU Anesthesia Type: General Level of consciousness: awake and alert Pain management: pain level controlled Vital Signs Assessment: post-procedure vital signs reviewed and stable Respiratory status: spontaneous breathing, nonlabored ventilation, respiratory function stable and patient connected to nasal cannula oxygen Cardiovascular status: blood pressure returned to baseline and stable Postop Assessment: no apparent nausea or vomiting Anesthetic complications: no    Last Vitals:  Vitals:   08/09/19 1503 08/09/19 1504  BP: 107/66   Pulse: 77 71  Resp: 17 13  Temp:  36.9 C  SpO2: 100% 100%    Last Pain:  Vitals:   08/09/19 1504  TempSrc:   PainSc: 0-No pain                 Tiajuana Amass

## 2019-08-09 NOTE — Anesthesia Procedure Notes (Signed)
Procedure Name: LMA Insertion Date/Time: 08/09/2019 1:30 PM Performed by: Janace Litten, CRNA Pre-anesthesia Checklist: Patient identified, Emergency Drugs available, Suction available and Patient being monitored Patient Re-evaluated:Patient Re-evaluated prior to induction Oxygen Delivery Method: Circle System Utilized Preoxygenation: Pre-oxygenation with 100% oxygen Induction Type: IV induction Ventilation: Mask ventilation without difficulty LMA: LMA inserted LMA Size: 3.0 Number of attempts: 1 Airway Equipment and Method: Bite block Placement Confirmation: positive ETCO2 Tube secured with: Tape Dental Injury: Teeth and Oropharynx as per pre-operative assessment

## 2019-08-09 NOTE — Transfer of Care (Signed)
Immediate Anesthesia Transfer of Care Note  Patient: Leah Martin  Procedure(s) Performed: SUCTION DILATATION AND CURETTAGE (N/A )  Patient Location: PACU  Anesthesia Type:General  Level of Consciousness: drowsy and responds to stimulation  Airway & Oxygen Therapy: Patient Spontanous Breathing  Post-op Assessment: Report given to RN and Post -op Vital signs reviewed and stable  Post vital signs: Reviewed and stable  Last Vitals:  Vitals Value Taken Time  BP 115/66 08/09/19 1411  Temp    Pulse 89 08/09/19 1412  Resp 13 08/09/19 1412  SpO2 100 % 08/09/19 1412  Vitals shown include unvalidated device data.  Last Pain:  Vitals:   08/09/19 1005  TempSrc: Oral  PainSc:          Complications: No apparent anesthesia complications

## 2019-08-09 NOTE — Progress Notes (Signed)
Report given to [facility], RN in short stay.

## 2019-08-10 ENCOUNTER — Encounter (HOSPITAL_COMMUNITY): Payer: Self-pay | Admitting: Obstetrics and Gynecology

## 2019-08-11 LAB — SURGICAL PATHOLOGY

## 2019-08-24 ENCOUNTER — Ambulatory Visit: Payer: Medicaid Other | Admitting: Obstetrics and Gynecology

## 2019-08-31 ENCOUNTER — Ambulatory Visit: Payer: Medicaid Other | Admitting: Obstetrics and Gynecology

## 2019-09-04 ENCOUNTER — Encounter: Payer: Self-pay | Admitting: Obstetrics and Gynecology

## 2019-09-04 ENCOUNTER — Ambulatory Visit: Payer: Medicaid Other | Admitting: Obstetrics and Gynecology

## 2020-04-20 ENCOUNTER — Other Ambulatory Visit: Payer: Self-pay

## 2020-04-20 ENCOUNTER — Encounter (HOSPITAL_COMMUNITY): Payer: Self-pay | Admitting: Obstetrics & Gynecology

## 2020-04-20 ENCOUNTER — Inpatient Hospital Stay (HOSPITAL_COMMUNITY)
Admission: AD | Admit: 2020-04-20 | Discharge: 2020-04-20 | Disposition: A | Discharge status: Home / Self Care | Payer: Medicaid Other | Attending: Obstetrics & Gynecology | Admitting: Obstetrics & Gynecology

## 2020-04-20 DIAGNOSIS — O2341 Unspecified infection of urinary tract in pregnancy, first trimester: Secondary | ICD-10-CM

## 2020-04-20 DIAGNOSIS — Z79899 Other long term (current) drug therapy: Secondary | ICD-10-CM | POA: Insufficient documentation

## 2020-04-20 DIAGNOSIS — R509 Fever, unspecified: Secondary | ICD-10-CM

## 2020-04-20 DIAGNOSIS — O0338 Urinary tract infection following incomplete spontaneous abortion: Secondary | ICD-10-CM | POA: Insufficient documentation

## 2020-04-20 DIAGNOSIS — B9620 Unspecified Escherichia coli [E. coli] as the cause of diseases classified elsewhere: Secondary | ICD-10-CM | POA: Insufficient documentation

## 2020-04-20 DIAGNOSIS — Z3A01 Less than 8 weeks gestation of pregnancy: Secondary | ICD-10-CM | POA: Insufficient documentation

## 2020-04-20 DIAGNOSIS — Z1612 Extended spectrum beta lactamase (ESBL) resistance: Secondary | ICD-10-CM | POA: Insufficient documentation

## 2020-04-20 DIAGNOSIS — O034 Incomplete spontaneous abortion without complication: Secondary | ICD-10-CM

## 2020-04-20 LAB — URINALYSIS, ROUTINE W REFLEX MICROSCOPIC
Bacteria, UA: NONE SEEN
Bilirubin Urine: NEGATIVE
Glucose, UA: NEGATIVE mg/dL
Hgb urine dipstick: NEGATIVE
Ketones, ur: NEGATIVE mg/dL
Leukocytes,Ua: NEGATIVE
Nitrite: NEGATIVE
Protein, ur: 30 mg/dL — AB
Specific Gravity, Urine: 1.029 (ref 1.005–1.030)
pH: 5 (ref 5.0–8.0)

## 2020-04-20 MED ORDER — PIPERACILLIN-TAZOBACTAM 3.375 G IVPB
3.3750 g | Freq: Once | INTRAVENOUS | Status: DC
  Administered 2020-04-20: 3.375 g via INTRAVENOUS
  Filled 2020-04-20: qty 50

## 2020-04-20 MED ORDER — NITROFURANTOIN MONOHYD MACRO 100 MG PO CAPS: 100.0000 mg | ORAL_CAPSULE | Freq: Two times a day (BID) | ORAL | 0 refills | Status: DC

## 2020-04-20 MED ORDER — PIPERACILLIN-TAZOBACTAM 3.375 G IVPB 30 MIN
3.3750 g | Freq: Once | INTRAVENOUS | Status: AC
  Administered 2020-04-20: 3.375 g via INTRAVENOUS
  Filled 2020-04-20: qty 50

## 2020-04-20 MED ORDER — PROMETHAZINE HCL 25 MG/ML IJ SOLN
12.5000 mg | Freq: Once | INTRAMUSCULAR | Status: AC
  Administered 2020-04-20: 12.5 mg via INTRAVENOUS
  Filled 2020-04-20: qty 1

## 2020-04-20 MED ORDER — PROMETHAZINE HCL 12.5 MG PO TABS: 12.5000 mg | ORAL_TABLET | ORAL | 0 refills | Status: DC | PRN

## 2020-04-20 MED ORDER — SODIUM CHLORIDE 0.9 % IV SOLN: INTRAVENOUS | Status: DC

## 2020-04-20 NOTE — Discharge Instructions (Signed)
Pregnancy and Urinary Tract Infection ° °A urinary tract infection (UTI) is an infection of any part of the urinary tract. This includes the kidneys, the tubes that connect your kidneys to your bladder (ureters), the bladder, and the tube that carries urine out of your body (urethra). These organs make, store, and get rid of urine in the body. Your health care provider may use other names to describe the infection. An upper UTI affects the ureters and kidneys (pyelonephritis). A lower UTI affects the bladder (cystitis) and urethra (urethritis). °Most urinary tract infections are caused by bacteria in your genital area, around the entrance to your urinary tract (urethra). These bacteria grow and cause irritation and inflammation of your urinary tract. You are more likely to develop a UTI during pregnancy because the physical and hormonal changes your body goes through can make it easier for bacteria to get into your urinary tract. Your growing baby also puts pressure on your bladder and can affect urine flow. It is important to recognize and treat UTIs in pregnancy because of the risk of serious complications for both you and your baby. °How does this affect me? °Symptoms of a UTI include: °· Needing to urinate right away (urgently). °· Frequent urination or passing small amounts of urine frequently. °· Pain or burning with urination. °· Blood in the urine. °· Urine that smells bad or unusual. °· Trouble urinating. °· Cloudy urine. °· Pain in the abdomen or lower back. °· Vaginal discharge. °You may also have: °· Vomiting or a decreased appetite. °· Confusion. °· Irritability or tiredness. °· A fever. °· Diarrhea. °How does this affect my baby? °An untreated UTI during pregnancy could lead to a kidney infection or a systemic infection, which can cause health problems that could affect your baby. Possible complications of an untreated UTI include: °· Giving birth to your baby before 37 weeks of pregnancy  (premature). °· Having a baby with a low birth weight. °· Developing high blood pressure during pregnancy (preeclampsia). °· Having a low hemoglobin level (anemia). °What can I do to lower my risk? °To prevent a UTI: °· Go to the bathroom as soon as you feel the need. Do not hold urine for long periods of time. °· Always wipe from front to back, especially after a bowel movement. Use each tissue one time when you wipe. °· Empty your bladder after sex. °· Keep your genital area dry. °· Drink 6-10 glasses of water each day. °· Do not douche or use deodorant sprays. °How is this treated? °Treatment for this condition may include: °· Antibiotic medicines that are safe to take during pregnancy. °· Other medicines to treat less common causes of UTI. °Follow these instructions at home: °· If you were prescribed an antibiotic medicine, take it as told by your health care provider. Do not stop using the antibiotic even if you start to feel better. °· Keep all follow-up visits as told by your health care provider. This is important. °Contact a health care provider if: °· Your symptoms do not improve or they get worse. °· You have abnormal vaginal discharge. °Get help right away if you: °· Have a fever. °· Have nausea and vomiting. °· Have back or side pain. °· Feel contractions in your uterus. °· Have lower belly pain. °· Have a gush of fluid from your vagina. °· Have blood in your urine. °Summary °· A urinary tract infection (UTI) is an infection of any part of the urinary tract, which includes the   kidneys, ureters, bladder, and urethra. °· Most urinary tract infections are caused by bacteria in your genital area, around the entrance to your urinary tract (urethra). °· You are more likely to develop a UTI during pregnancy. °· If you were prescribed an antibiotic medicine, take it as told by your health care provider. Do not stop using the antibiotic even if you start to feel better. °This information is not intended to  replace advice given to you by your health care provider. Make sure you discuss any questions you have with your health care provider. °Document Revised: 12/23/2018 Document Reviewed: 08/04/2018 °Elsevier Patient Education © 2020 Elsevier Inc. ° °

## 2020-04-20 NOTE — MAU Note (Signed)
Leah Martin is a 24 y.o. at [redacted]w[redacted]d here in MAU reporting: when she was in [place] she was Rx an antibiotic for UTI, states she took the whole Rx and then they called her and told her to take a different antibiotic, has been unable to pick up 2nd antibiotic due to delays at [facility]. States since her visit to [facility] has continued to have abdominal pain and right flank pain, reports pain is worse. No vaginal bleeding or discharge. Reports no BM since last Sunday. Denies urinary urgency and dysuria but states she does have some frequency.  Onset of complaint: ongoing  Pain score: abdominal pain 10/10, flank pain 8/10  Vitals:   04/20/20 1854  BP: 116/65  Pulse: 82  Resp: 16  Temp: 98.5 F (36.9 C)  SpO2: 98%     Lab orders placed from triage: UA

## 2020-04-20 NOTE — MAU Provider Note (Signed)
Chief Complaint: Abdominal Pain and Flank Pain   First Provider Initiated Contact with Patient 04/20/20 1910     SUBJECTIVE HPI: Leah Martin is a 24 y.o. G2P0010 at [redacted]w[redacted]d who presents to [facility] reporting flank pain. Reports that she went to the [place] ED last week and was diagnosed with a UTI. Completed the antibiotics but was then called & told she needed a new antibiotic based on the urine culture. Was prescribed fosfomycin but hasn't been able to pick up the prescription; she called 3 pharmacies but was told the medication was on back order.  Currently reports right flank pain & nausea. Denies fever/chills, dysuria, vaginal bleeding, or hematuria.   Location: right flank pain Quality: sharp, pressure Severity: 8/10 on pain scale Duration: 1 day Timing: constant Modifying factors: none Associated signs and symptoms: nausea  Past Medical History:  Diagnosis Date  . Medical history non-contributory    OB History  Gravida Para Term Preterm AB Living  2       1    SAB TAB Ectopic Multiple Live Births  1            # Outcome Date GA Lbr Len/2nd Weight Sex Delivery Anes PTL Lv  2 Current           1 SAB 07/2019           Past Surgical History:  Procedure Laterality Date  . DILATION AND CURETTAGE OF UTERUS N/A 08/09/2019   Procedure: SUCTION DILATATION AND CURETTAGE;  Surgeon: Conan Bowens, MD;  Location: [facility] OR;  Service: Gynecology;  Laterality: N/A;   Social History   Socioeconomic History  . Marital status: Single    Spouse name: Not on file  . Number of children: Not on file  . Years of education: Not on file  . Highest education level: Not on file  Occupational History  . Not on file  Tobacco Use  . Smoking status: Never Smoker  . Smokeless tobacco: Never Used  Vaping Use  . Vaping Use: Never used  Substance and Sexual Activity  . Alcohol use: No  . Drug use: No  . Sexual activity: Yes    Birth control/protection: None  Other Topics  Concern  . Not on file  Social History Narrative  . Not on file   Social Determinants of Health   Financial Resource Strain:   . Difficulty of Paying Living Expenses:   Food Insecurity:   . Worried About Programme researcher, broadcasting/film/video in the Last Year:   . Barista in the Last Year:   Transportation Needs:   . Freight forwarder (Medical):   Marland Kitchen Lack of Transportation (Non-Medical):   Physical Activity:   . Days of Exercise per Week:   . Minutes of Exercise per Session:   Stress:   . Feeling of Stress :   Social Connections:   . Frequency of Communication with Friends and Family:   . Frequency of Social Gatherings with Friends and Family:   . Attends Religious Services:   . Active Member of Clubs or Organizations:   . Attends Banker Meetings:   Marland Kitchen Marital Status:   Intimate Partner Violence:   . Fear of Current or Ex-Partner:   . Emotionally Abused:   Marland Kitchen Physically Abused:   . Sexually Abused:    Family History  Problem Relation Age of Onset  . Kidney disease Mother    No current facility-administered medications on file prior to  encounter.   Current Outpatient Medications on File Prior to Encounter  Medication Sig Dispense Refill  . ondansetron (ZOFRAN-ODT) 4 MG disintegrating tablet Take by mouth.    . cephALEXin (KEFLEX) 500 MG capsule Take 500 mg by mouth 3 (three) times daily.     No Known Allergies  I have reviewed patient's Past Medical Hx, Surgical Hx, Family Hx, Social Hx, medications and allergies.   Review of Systems  Constitutional: Negative.   Gastrointestinal: Positive for abdominal pain and nausea. Negative for constipation, diarrhea and vomiting.  Genitourinary: Positive for flank pain. Negative for dysuria, hematuria and urgency.    OBJECTIVE Patient Vitals for the past 24 hrs:  BP Temp Temp src Pulse Resp SpO2 Height Weight  04/20/20 1854 116/65 98.5 F (36.9 C) Oral 82 16 98 % -- --  04/20/20 1848 -- -- -- -- -- -- 5\' 2"  (1.575  m) 52.3 kg   Constitutional: Well-developed, well-nourished female in no acute distress.  Cardiovascular: normal rate & rhythm, no murmur Respiratory: normal rate and effort. Lung sounds clear throughout GI: No CVA tenderness.  Abd soft, non-tender, Pos BS x 4. No guarding or rebound tenderness MS: Extremities nontender, no edema, normal ROM Neurologic: Alert and oriented x 4.    LAB RESULTS Results for orders placed or performed during the hospital encounter of 04/20/20 (from the past 24 hour(s))  Urinalysis, Routine w reflex microscopic Urine, Clean Catch     Status: Abnormal   Collection Time: 04/20/20  7:27 PM  Result Value Ref Range   Color, Urine AMBER (A) YELLOW   APPearance CLEAR CLEAR   Specific Gravity, Urine 1.029 1.005 - 1.030   pH 5.0 5.0 - 8.0   Glucose, UA NEGATIVE NEGATIVE mg/dL   Hgb urine dipstick NEGATIVE NEGATIVE   Bilirubin Urine NEGATIVE NEGATIVE   Ketones, ur NEGATIVE NEGATIVE mg/dL   Protein, ur 30 (A) NEGATIVE mg/dL   Nitrite NEGATIVE NEGATIVE   Leukocytes,Ua NEGATIVE NEGATIVE   RBC / HPF 0-5 0 - 5 RBC/hpf   WBC, UA 0-5 0 - 5 WBC/hpf   Bacteria, UA NONE SEEN NONE SEEN   Squamous Epithelial / LPF 0-5 0 - 5   Mucus PRESENT    Hyaline Casts, UA PRESENT     IMAGING No results found.  MAU COURSE Orders Placed This Encounter  Procedures  . Urinalysis, Routine w reflex microscopic Urine, Clean Catch  . Discharge patient   Meds ordered this encounter  Medications  . DISCONTD: piperacillin-tazobactam (ZOSYN) IVPB 3.375 g    Order Specific Question:   Antibiotic Indication:    Answer:   Other Indication (list below)    Order Specific Question:   Other Indication:    Answer:   UTI  . 0.9 %  sodium chloride infusion  . promethazine (PHENERGAN) injection 12.5 mg  . piperacillin-tazobactam (ZOSYN) IVPB 3.375 g    Order Specific Question:   Antibiotic Indication:    Answer:   Other Indication (list below)    Order Specific Question:   Other Indication:     Answer:   uti  . nitrofurantoin, macrocrystal-monohydrate, (MACROBID) 100 MG capsule    Sig: Take 1 capsule (100 mg total) by mouth 2 (two) times daily.    Dispense:  14 capsule    Refill:  0    Order Specific Question:   Supervising Provider    Answer:   06/20/20 [3804]  . promethazine (PHENERGAN) 12.5 MG tablet    Sig: Take 1 tablet (12.5  mg total) by mouth every 4 (four) hours as needed for nausea or vomiting.    Dispense:  30 tablet    Refill:  0    Order Specific Question:   Supervising Provider    Answer:   Adam Phenix [phone]    MDM Urine culture reviewed per care everywhere. ESBL producing e coli sensitive to zosyn, macrobid, bactrim, & fosfomycin. Pt is afebrile & no CVA tenderness. U/a looks better today.   Reviewed patient with Dr. Debroah Loop. Will give one dose of IV zosyn in MAU & discharge home with macrobid.   ASSESSMENT 1. UTI (urinary tract infection) during pregnancy, first trimester   2. [redacted] weeks gestation of pregnancy   3. Fever of unknown origin   4. Retained products of conception after miscarriage     PLAN Discharge home in stable condition. Reviewed reasons to return to MAU Start prenatal care Rx phenergan for nausea per patient request   Follow-up Information    [facility] Follow up.   Specialty: Obstetrics and Gynecology Why: the office will call you to schedule prenatal care Contact information: [address] [place] [facility] [place] [address] [phone]             Allergies as of 04/20/2020   No Known Allergies     Medication List    STOP taking these medications   cephALEXin 500 MG capsule Commonly known as: KEFLEX   ondansetron 4 MG disintegrating tablet Commonly known as: ZOFRAN-ODT     TAKE these medications   nitrofurantoin (macrocrystal-monohydrate) 100 MG capsule Commonly known as: MACROBID Take 1 capsule (100 mg total) by mouth 2 (two) times daily.   promethazine 12.5 MG  tablet Commonly known as: PHENERGAN Take 1 tablet (12.5 mg total) by mouth every 4 (four) hours as needed for nausea or vomiting.        Judeth Horn, NP 04/20/2020  8:45 PM

## 2020-04-30 ENCOUNTER — Other Ambulatory Visit: Payer: Self-pay

## 2020-04-30 ENCOUNTER — Ambulatory Visit (INDEPENDENT_AMBULATORY_CARE_PROVIDER_SITE_OTHER): Payer: Self-pay | Admitting: *Deleted

## 2020-04-30 VITALS — Wt 115.0 lb

## 2020-04-30 DIAGNOSIS — Z3480 Encounter for supervision of other normal pregnancy, unspecified trimester: Secondary | ICD-10-CM | POA: Insufficient documentation

## 2020-04-30 DIAGNOSIS — Z3481 Encounter for supervision of other normal pregnancy, first trimester: Secondary | ICD-10-CM

## 2020-04-30 DIAGNOSIS — Z3A09 9 weeks gestation of pregnancy: Secondary | ICD-10-CM

## 2020-04-30 HISTORY — DX: Encounter for supervision of other normal pregnancy, unspecified trimester: Z34.80

## 2020-04-30 MED ORDER — PRENATAL PLUS/IRON 27-1 MG PO TABS: 1.0000 | ORAL_TABLET | Freq: Every day | ORAL | 12 refills | Status: DC

## 2020-04-30 NOTE — Patient Instructions (Addendum)
First Trimester of Pregnancy  The first trimester of pregnancy is from week 1 until the end of week 13 (months 1 through 3). During this time, your baby will begin to develop inside you. At 6-8 weeks, the eyes and face are formed, and the heartbeat can be seen on ultrasound. At the end of 12 weeks, all the baby's organs are formed. Prenatal care is all the medical care you receive before the birth of your baby. Make sure you get good prenatal care and follow all of your doctor's instructions. Follow these instructions at home: Medicines  Take over-the-counter and prescription medicines only as told by your doctor. Some medicines are safe and some medicines are not safe during pregnancy.  Take a prenatal vitamin that contains at least 600 micrograms (mcg) of folic acid.  If you have trouble pooping (constipation), take medicine that will make your stool soft (stool softener) if your doctor approves. Eating and drinking   Eat regular, healthy meals.  Your doctor will tell you the amount of weight gain that is right for you.  Avoid raw meat and uncooked cheese.  If you feel sick to your stomach (nauseous) or throw up (vomit): ? Eat 4 or 5 small meals a day instead of 3 large meals. ? Try eating a few soda crackers. ? Drink liquids between meals instead of during meals.  To prevent constipation: ? Eat foods that are high in fiber, like fresh fruits and vegetables, whole grains, and beans. ? Drink enough fluids to keep your pee (urine) clear or pale yellow. Activity  Exercise only as told by your doctor. Stop exercising if you have cramps or pain in your lower belly (abdomen) or low back.  Do not exercise if it is too hot, too humid, or if you are in a place of great height (high altitude).  Try to avoid standing for long periods of time. Move your legs often if you must stand in one place for a long time.  Avoid heavy lifting.  Wear low-heeled shoes. Sit and stand up  straight.  You can have sex unless your doctor tells you not to. Relieving pain and discomfort  Wear a good support bra if your breasts are sore.  Take warm water baths (sitz baths) to soothe pain or discomfort caused by hemorrhoids. Use hemorrhoid cream if your doctor says it is okay.  Rest with your legs raised if you have leg cramps or low back pain.  If you have puffy, bulging veins (varicose veins) in your legs: ? Wear support hose or compression stockings as told by your doctor. ? Raise (elevate) your feet for 15 minutes, 3-4 times a day. ? Limit salt in your food. Prenatal care  Schedule your prenatal visits by the twelfth week of pregnancy.  Write down your questions. Take them to your prenatal visits.  Keep all your prenatal visits as told by your doctor. This is important. Safety  Wear your seat belt at all times when driving.  Make a list of emergency phone numbers. The list should include numbers for family, friends, the hospital, and police and fire departments. General instructions  Ask your doctor for a referral to a local prenatal class. Begin classes no later than at the start of month 6 of your pregnancy.  Ask for help if you need counseling or if you need help with nutrition. Your doctor can give you advice or tell you where to go for help.  Do not use hot tubs, steam   tubs, steam rooms, or saunas.  Do not douche or use tampons or scented sanitary pads.  Do not cross your legs for long periods of time.  Avoid all herbs and alcohol. Avoid drugs that are not approved by your doctor.  Do not use any tobacco products, including cigarettes, chewing tobacco, and electronic cigarettes. If you need help quitting, ask your doctor. You may get counseling or other support to help you quit.  Avoid cat litter boxes and soil used by cats. These carry germs that can cause birth defects in the baby and can cause a loss of your baby (miscarriage) or stillbirth.  Visit your dentist.  At home, brush your teeth with a soft toothbrush. Be gentle when you floss. Contact a doctor if:  You are dizzy.  You have mild cramps or pressure in your lower belly.  You have a nagging pain in your belly area.  You continue to feel sick to your stomach, you throw up, or you have watery poop (diarrhea).  You have a bad smelling fluid coming from your vagina.  You have pain when you pee (urinate).  You have increased puffiness (swelling) in your face, hands, legs, or ankles. Get help right away if:  You have a fever.  You are leaking fluid from your vagina.  You have spotting or bleeding from your vagina.  You have very bad belly cramping or pain.  You gain or lose weight rapidly.  You throw up blood. It may look like coffee grounds.  You are around people who have [place] measles, fifth disease, or chickenpox.  You have a very bad headache.  You have shortness of breath.  You have any kind of trauma, such as from a fall or a car accident. Summary  The first trimester of pregnancy is from week 1 until the end of week 13 (months 1 through 3).  To take care of yourself and your unborn baby, you will need to eat healthy meals, take medicines only if your doctor tells you to do so, and do activities that are safe for you and your baby.  Keep all follow-up visits as told by your doctor. This is important as your doctor will have to ensure that your baby is healthy and growing well. This information is not intended to replace advice given to you by your health care provider. Make sure you discuss any questions you have with your health care provider. Document Revised: 12/22/2018 Document Reviewed: 09/08/2016 Elsevier Patient Education  2020 [employer].  Warning Signs During Pregnancy A pregnancy lasts about 40 weeks, starting from the first day of your last period until the baby is born. Pregnancy is divided into three phases called trimesters.  The first trimester  refers to week 1 through week 13 of pregnancy.  The second trimester is the start of week 14 through the end of week 27.  The third trimester is the start of week 28 until you deliver your baby. During each trimester of pregnancy, certain signs and symptoms may indicate a problem. Talk with your health care provider about your current health and any medical conditions you have. Make sure you know the symptoms that you should watch for and report. How does this affect me?  Warning signs in the first trimester While some changes during the first trimester may be uncomfortable, most do not represent a serious problem. Let your health care provider know if you have any of the following warning signs in the first trimester:  You cannot eat or drink without vomiting, and this lasts for longer than a day.  You have vaginal bleeding or spotting along with menstrual-like cramping.  You have diarrhea for longer than a day.  You have a fever or other signs of infection, such as: ? Pain or burning when you urinate. ? Foul smelling or thick or yellowish vaginal discharge. Warning signs in the second trimester As your baby grows and changes during the second trimester, there are additional signs and symptoms that may indicate a problem. These include:  Signs and symptoms of infection, including a fever.  Signs or symptoms of a miscarriage or preterm labor, such as regular contractions, menstrual-like cramping, or lower abdominal pain.  Bloody or watery vaginal discharge or obvious vaginal bleeding.  Feeling like your heart is pounding.  Having trouble breathing.  Nausea, vomiting, or diarrhea that lasts for longer than a day.  Craving non-food items, such as clay, chalk, or dirt. This may be a sign of a very treatable medical condition called pica. Later in your second trimester, watch for signs and symptoms of a serious medical condition called preeclampsia.These include:  Changes in your  vision.  A severe headache that does not go away.  Nausea and vomiting. It is also important to notice if your baby stops moving or moves less than usual during this time. Warning signs in the third trimester As you approach the third trimester, your baby is growing and your body is preparing for the birth of your baby. In your third trimester, be sure to let your health care provider know if:  You have signs and symptoms of infection, including a fever.  You have vaginal bleeding.  You notice that your baby is moving less than usual or is not moving.  You have nausea, vomiting, or diarrhea that lasts for longer than a day.  You have a severe headache that does not go away.  You have vision changes, including seeing spots or having blurry or double vision.  You have increased swelling in your hands or face. How does this affect my baby? Throughout your pregnancy, always report any of the warning signs of a problem to your health care provider. This can help prevent complications that may affect your baby, including:  Increased risk for premature birth.  Infection that may be transmitted to your baby.  Increased risk for stillbirth. Contact a health care provider if:  You have any of the warning signs of a problem for the current trimester of your pregnancy.  Any of the following apply to you during any trimester of pregnancy: ? You have strong emotions, such as sadness or anxiety, that interfere with work or personal relationships. ? You feel unsafe in your home and need help finding a safe place to live. ? You are using tobacco products, alcohol, or drugs and you need help to stop. Get help right away if: You have signs or symptoms of labor before 37 weeks of pregnancy. These include:  Contractions that are 5 minutes or less apart, or that increase in frequency, intensity, or length.  Sudden, sharp abdominal pain or low back pain.  Uncontrolled gush or trickle of fluid  from your vagina. Summary  A pregnancy lasts about 40 weeks, starting from the first day of your last period until the baby is born. Pregnancy is divided into three phases called trimesters. Each trimester has warning signs to watch for.  Always report any warning signs to your health care provider in  order to prevent complications that may affect both you and your baby.  Talk with your health care provider about your current health and any medical conditions you have. Make sure you know the symptoms that you should watch for and report. This information is not intended to replace advice given to you by your health care provider. Make sure you discuss any questions you have with your health care provider. Document Revised: 12/20/2018 Document Reviewed: 06/17/2017 Elsevier Patient Education  2020 [employer] Inc.  

## 2020-04-30 NOTE — Progress Notes (Signed)
  Virtual Visit via Telephone Note  I connected with Leah Martin on 04/30/20 at  2:10 PM EDT by telephone and verified that I am speaking with the correct person using two identifiers.  Location: Patient: Home Provider: [facility]   I discussed the limitations, risks, security and privacy concerns of performing an evaluation and management service by telephone and the availability of in person appointments. I also discussed with the patient that there may be a patient responsible charge related to this service. The patient expressed understanding and agreed to proceed.   History of Present Illness: PRENATAL INTAKE SUMMARY  Leah Martin presents today New OB Nurse Interview.  OB History    Gravida  2   Para      Term      Preterm      AB  1   Living        SAB  1   TAB      Ectopic      Multiple      Live Births             I have reviewed the patient's medical, obstetrical, social, and family histories, medications, and available lab results.  SUBJECTIVE She has no unusual complaints   Observations/Objective: Initial nurse interview for history/labs (New OB)  EDD: 12/02/2020 by ultrasound GA: [redacted]w[redacted]d [id number] FHT: non face to face interview  GENERAL APPEARANCE: non face to face interview  Assessment and Plan: Normal pregnancy Prenatal care: [facility] Labs to be completed at next visit with Raelyn Mora, CNM 05/15/20 Rx for PNV sent to pharmacy Patient to sign up for [employer] Patient reported having BP monitor and weight scale at home   Follow Up Instructions:   I discussed the assessment and treatment plan with the patient. The patient was provided an opportunity to ask questions and all were answered. The patient agreed with the plan and demonstrated an understanding of the instructions.   The patient was advised to call back or seek an in-person evaluation if the symptoms worsen or if the condition fails to improve as  anticipated.  I provided 15 minutes of non-face-to-face time during this encounter.   Clovis Pu, RN

## 2020-05-15 ENCOUNTER — Other Ambulatory Visit: Payer: Self-pay

## 2020-05-15 ENCOUNTER — Encounter: Payer: Self-pay | Admitting: Obstetrics and Gynecology

## 2020-05-15 ENCOUNTER — Other Ambulatory Visit (HOSPITAL_COMMUNITY)
Admission: RE | Admit: 2020-05-15 | Discharge: 2020-05-15 | Disposition: A | Discharge status: Home / Self Care | Payer: Medicaid Other | Source: Ambulatory Visit | Attending: Obstetrics and Gynecology | Admitting: Obstetrics and Gynecology

## 2020-05-15 ENCOUNTER — Ambulatory Visit (INDEPENDENT_AMBULATORY_CARE_PROVIDER_SITE_OTHER): Payer: Self-pay | Admitting: Obstetrics and Gynecology

## 2020-05-15 VITALS — BP 112/73 | HR 94 | Temp 98.4°F | Wt 118.2 lb

## 2020-05-15 DIAGNOSIS — B373 Candidiasis of vulva and vagina: Secondary | ICD-10-CM | POA: Insufficient documentation

## 2020-05-15 DIAGNOSIS — O23591 Infection of other part of genital tract in pregnancy, first trimester: Secondary | ICD-10-CM | POA: Diagnosis not present

## 2020-05-15 DIAGNOSIS — Z3480 Encounter for supervision of other normal pregnancy, unspecified trimester: Secondary | ICD-10-CM

## 2020-05-15 DIAGNOSIS — Z3A11 11 weeks gestation of pregnancy: Secondary | ICD-10-CM | POA: Insufficient documentation

## 2020-05-15 DIAGNOSIS — R87612 Low grade squamous intraepithelial lesion on cytologic smear of cervix (LGSIL): Secondary | ICD-10-CM | POA: Insufficient documentation

## 2020-05-15 DIAGNOSIS — Z3481 Encounter for supervision of other normal pregnancy, first trimester: Secondary | ICD-10-CM | POA: Diagnosis present

## 2020-05-15 NOTE — Patient Instructions (Signed)
First Trimester of Pregnancy °The first trimester of pregnancy is from week 1 until the end of week 13 (months 1 through 3). A week after a sperm fertilizes an egg, the egg will implant on the wall of the uterus. This embryo will begin to develop into a baby. Genes from you and your partner will form the baby. The female genes will determine whether the baby will be a boy or a girl. At 6-8 weeks, the eyes and face will be formed, and the heartbeat can be seen on ultrasound. At the end of 12 weeks, all the baby's organs will be formed. °Now that you are pregnant, you will want to do everything you can to have a healthy baby. Two of the most important things are to get good prenatal care and to follow your health care provider's instructions. Prenatal care is all the medical care you receive before the baby's birth. This care will help prevent, find, and treat any problems during the pregnancy and childbirth. °Body changes during your first trimester °Your body goes through many changes during pregnancy. The changes vary from woman to woman. °· You may gain or lose a couple of pounds at first. °· You may feel sick to your stomach (nauseous) and you may throw up (vomit). If the vomiting is uncontrollable, call your health care provider. °· You may tire easily. °· You may develop headaches that can be relieved by medicines. All medicines should be approved by your health care provider. °· You may urinate more often. Painful urination may mean you have a bladder infection. °· You may develop heartburn as a result of your pregnancy. °· You may develop constipation because certain hormones are causing the muscles that push stool through your intestines to slow down. °· You may develop hemorrhoids or swollen veins (varicose veins). °· Your breasts may begin to grow larger and become tender. Your nipples may stick out more, and the tissue that surrounds them (areola) may become darker. °· Your gums may bleed and may be  sensitive to brushing and flossing. °· Dark spots or blotches (chloasma, mask of pregnancy) may develop on your face. This will likely fade after the baby is born. °· Your menstrual periods will stop. °· You may have a loss of appetite. °· You may develop cravings for certain kinds of food. °· You may have changes in your emotions from day to day, such as being excited to be pregnant or being concerned that something may go wrong with the pregnancy and baby. °· You may have more vivid and strange dreams. °· You may have changes in your hair. These can include thickening of your hair, rapid growth, and changes in texture. Some women also have hair loss during or after pregnancy, or hair that feels dry or thin. Your hair will most likely return to normal after your baby is born. °What to expect at prenatal visits °During a routine prenatal visit: °· You will be weighed to make sure you and the baby are growing normally. °· Your blood pressure will be taken. °· Your abdomen will be measured to track your baby's growth. °· The fetal heartbeat will be listened to between weeks 10 and 14 of your pregnancy. °· Test results from any previous visits will be discussed. °Your health care provider may ask you: °· How you are feeling. °· If you are feeling the baby move. °· If you have had any abnormal symptoms, such as leaking fluid, bleeding, severe headaches, or abdominal   cramping. °· If you are using any tobacco products, including cigarettes, chewing tobacco, and electronic cigarettes. °· If you have any questions. °Other tests that may be performed during your first trimester include: °· Blood tests to find your blood type and to check for the presence of any previous infections. The tests will also be used to check for low iron levels (anemia) and protein on red blood cells (Rh antibodies). Depending on your risk factors, or if you previously had diabetes during pregnancy, you may have tests to check for high blood sugar  that affects pregnant women (gestational diabetes). °· Urine tests to check for infections, diabetes, or protein in the urine. °· An ultrasound to confirm the proper growth and development of the baby. °· Fetal screens for spinal cord problems (spina bifida) and Down syndrome. °· HIV (human immunodeficiency virus) testing. Routine prenatal testing includes screening for HIV, unless you choose not to have this test. °· You may need other tests to make sure you and the baby are doing well. °Follow these instructions at home: °Medicines °· Follow your health care provider's instructions regarding medicine use. Specific medicines may be either safe or unsafe to take during pregnancy. °· Take a prenatal vitamin that contains at least 600 micrograms (mcg) of folic acid. °· If you develop constipation, try taking a stool softener if your health care provider approves. °Eating and drinking ° °· Eat a balanced diet that includes fresh fruits and vegetables, whole grains, good sources of protein such as meat, eggs, or tofu, and low-fat dairy. Your health care provider will help you determine the amount of weight gain that is right for you. °· Avoid raw meat and uncooked cheese. These carry germs that can cause birth defects in the baby. °· Eating four or five small meals rather than three large meals a day may help relieve nausea and vomiting. If you start to feel nauseous, eating a few soda crackers can be helpful. Drinking liquids between meals, instead of during meals, also seems to help ease nausea and vomiting. °· Limit foods that are high in fat and processed sugars, such as fried and sweet foods. °· To prevent constipation: °? Eat foods that are high in fiber, such as fresh fruits and vegetables, whole grains, and beans. °? Drink enough fluid to keep your urine clear or pale yellow. °Activity °· Exercise only as directed by your health care provider. Most women can continue their usual exercise routine during  pregnancy. Try to exercise for 30 minutes at least 5 days a week. Exercising will help you: °? Control your weight. °? Stay in shape. °? Be prepared for labor and delivery. °· Experiencing pain or cramping in the lower abdomen or lower back is a good sign that you should stop exercising. Check with your health care provider before continuing with normal exercises. °· Try to avoid standing for long periods of time. Move your legs often if you must stand in one place for a long time. °· Avoid heavy lifting. °· Wear low-heeled shoes and practice good posture. °· You may continue to have sex unless your health care provider tells you not to. °Relieving pain and discomfort °· Wear a good support bra to relieve breast tenderness. °· Take warm sitz baths to soothe any pain or discomfort caused by hemorrhoids. Use hemorrhoid cream if your health care provider approves. °· Rest with your legs elevated if you have leg cramps or low back pain. °· If you develop varicose veins in   your legs, wear support hose. Elevate your feet for 15 minutes, 3-4 times a day. Limit salt in your diet. Prenatal care  Schedule your prenatal visits by the twelfth week of pregnancy. They are usually scheduled monthly at first, then more often in the last 2 months before delivery.  Write down your questions. Take them to your prenatal visits.  Keep all your prenatal visits as told by your health care provider. This is important. Safety  Wear your seat belt at all times when driving.  Make a list of emergency phone numbers, including numbers for family, friends, the hospital, and police and fire departments. General instructions  Ask your health care provider for a referral to a local prenatal education class. Begin classes no later than the beginning of month 6 of your pregnancy.  Ask for help if you have counseling or nutritional needs during pregnancy. Your health care provider can offer advice or refer you to specialists for help  with various needs.  Do not use hot tubs, steam rooms, or saunas.  Do not douche or use tampons or scented sanitary pads.  Do not cross your legs for long periods of time.  Avoid cat litter boxes and soil used by cats. These carry germs that can cause birth defects in the baby and possibly loss of the fetus by miscarriage or stillbirth.  Avoid all smoking, herbs, alcohol, and medicines not prescribed by your health care provider. Chemicals in these products affect the formation and growth of the baby.  Do not use any products that contain nicotine or tobacco, such as cigarettes and e-cigarettes. If you need help quitting, ask your health care provider. You may receive counseling support and other resources to help you quit.  Schedule a dentist appointment. At home, brush your teeth with a soft toothbrush and be gentle when you floss. Contact a health care provider if:  You have dizziness.  You have mild pelvic cramps, pelvic pressure, or nagging pain in the abdominal area.  You have persistent nausea, vomiting, or diarrhea.  You have a bad smelling vaginal discharge.  You have pain when you urinate.  You notice increased swelling in your face, hands, legs, or ankles.  You are exposed to fifth disease or chickenpox.  You are exposed to [place] measles (rubella) and have never had it. Get help right away if:  You have a fever.  You are leaking fluid from your vagina.  You have spotting or bleeding from your vagina.  You have severe abdominal cramping or pain.  You have rapid weight gain or loss.  You vomit blood or material that looks like coffee grounds.  You develop a severe headache.  You have shortness of breath.  You have any kind of trauma, such as from a fall or a car accident. Summary  The first trimester of pregnancy is from week 1 until the end of week 13 (months 1 through 3).  Your body goes through many changes during pregnancy. The changes vary from  woman to woman.  You will have routine prenatal visits. During those visits, your health care provider will examine you, discuss any test results you may have, and talk with you about how you are feeling. This information is not intended to replace advice given to you by your health care provider. Make sure you discuss any questions you have with your health care provider. Document Revised: 08/13/2017 Document Reviewed: 08/12/2016 Elsevier Patient Education  2020 [employer]. Second Trimester of Pregnancy The second  trimester is from week 14 through week 27 (months 4 through 6). The second trimester is often a time when you feel your best. Your body has adjusted to being pregnant, and you begin to feel better physically. Usually, morning sickness has lessened or quit completely, you may have more energy, and you may have an increase in appetite. The second trimester is also a time when the fetus is growing rapidly. At the end of the sixth month, the fetus is about 9 inches long and weighs about 1 pounds. You will likely begin to feel the baby move (quickening) between 16 and 20 weeks of pregnancy. Body changes during your second trimester Your body continues to go through many changes during your second trimester. The changes vary from woman to woman.  Your weight will continue to increase. You will notice your lower abdomen bulging out.  You may begin to get stretch marks on your hips, abdomen, and breasts.  You may develop headaches that can be relieved by medicines. The medicines should be approved by your health care provider.  You may urinate more often because the fetus is pressing on your bladder.  You may develop or continue to have heartburn as a result of your pregnancy.  You may develop constipation because certain hormones are causing the muscles that push waste through your intestines to slow down.  You may develop hemorrhoids or swollen, bulging veins (varicose veins).  You  may have back pain. This is caused by: ? Weight gain. ? Pregnancy hormones that are relaxing the joints in your pelvis. ? A shift in weight and the muscles that support your balance.  Your breasts will continue to grow and they will continue to become tender.  Your gums may bleed and may be sensitive to brushing and flossing.  Dark spots or blotches (chloasma, mask of pregnancy) may develop on your face. This will likely fade after the baby is born.  A dark line from your belly button to the pubic area (linea nigra) may appear. This will likely fade after the baby is born.  You may have changes in your hair. These can include thickening of your hair, rapid growth, and changes in texture. Some women also have hair loss during or after pregnancy, or hair that feels dry or thin. Your hair will most likely return to normal after your baby is born. What to expect at prenatal visits During a routine prenatal visit:  You will be weighed to make sure you and the fetus are growing normally.  Your blood pressure will be taken.  Your abdomen will be measured to track your baby's growth.  The fetal heartbeat will be listened to.  Any test results from the previous visit will be discussed. Your health care provider may ask you:  How you are feeling.  If you are feeling the baby move.  If you have had any abnormal symptoms, such as leaking fluid, bleeding, severe headaches, or abdominal cramping.  If you are using any tobacco products, including cigarettes, chewing tobacco, and electronic cigarettes.  If you have any questions. Other tests that may be performed during your second trimester include:  Blood tests that check for: ? Low iron levels (anemia). ? High blood sugar that affects pregnant women (gestational diabetes) between 36 and 28 weeks. ? Rh antibodies. This is to check for a protein on red blood cells (Rh factor).  Urine tests to check for infections, diabetes, or protein in  the urine.  An ultrasound to  confirm the proper growth and development of the baby.  An amniocentesis to check for possible genetic problems.  Fetal screens for spina bifida and Down syndrome.  HIV (human immunodeficiency virus) testing. Routine prenatal testing includes screening for HIV, unless you choose not to have this test. Follow these instructions at home: Medicines  Follow your health care provider's instructions regarding medicine use. Specific medicines may be either safe or unsafe to take during pregnancy.  Take a prenatal vitamin that contains at least 600 micrograms (mcg) of folic acid.  If you develop constipation, try taking a stool softener if your health care provider approves. Eating and drinking   Eat a balanced diet that includes fresh fruits and vegetables, whole grains, good sources of protein such as meat, eggs, or tofu, and low-fat dairy. Your health care provider will help you determine the amount of weight gain that is right for you.  Avoid raw meat and uncooked cheese. These carry germs that can cause birth defects in the baby.  If you have low calcium intake from food, talk to your health care provider about whether you should take a daily calcium supplement.  Limit foods that are high in fat and processed sugars, such as fried and sweet foods.  To prevent constipation: ? Drink enough fluid to keep your urine clear or pale yellow. ? Eat foods that are high in fiber, such as fresh fruits and vegetables, whole grains, and beans. Activity  Exercise only as directed by your health care provider. Most women can continue their usual exercise routine during pregnancy. Try to exercise for 30 minutes at least 5 days a week. Stop exercising if you experience uterine contractions.  Avoid heavy lifting, wear low heel shoes, and practice good posture.  A sexual relationship may be continued unless your health care provider directs you otherwise. Relieving pain  and discomfort  Wear a good support bra to prevent discomfort from breast tenderness.  Take warm sitz baths to soothe any pain or discomfort caused by hemorrhoids. Use hemorrhoid cream if your health care provider approves.  Rest with your legs elevated if you have leg cramps or low back pain.  If you develop varicose veins, wear support hose. Elevate your feet for 15 minutes, 3-4 times a day. Limit salt in your diet. Prenatal Care  Write down your questions. Take them to your prenatal visits.  Keep all your prenatal visits as told by your health care provider. This is important. Safety  Wear your seat belt at all times when driving.  Make a list of emergency phone numbers, including numbers for family, friends, the hospital, and police and fire departments. General instructions  Ask your health care provider for a referral to a local prenatal education class. Begin classes no later than the beginning of month 6 of your pregnancy.  Ask for help if you have counseling or nutritional needs during pregnancy. Your health care provider can offer advice or refer you to specialists for help with various needs.  Do not use hot tubs, steam rooms, or saunas.  Do not douche or use tampons or scented sanitary pads.  Do not cross your legs for long periods of time.  Avoid cat litter boxes and soil used by cats. These carry germs that can cause birth defects in the baby and possibly loss of the fetus by miscarriage or stillbirth.  Avoid all smoking, herbs, alcohol, and unprescribed drugs. Chemicals in these products can affect the formation and growth of the baby.  Do not use any products that contain nicotine or tobacco, such as cigarettes and e-cigarettes. If you need help quitting, ask your health care provider.  Visit your dentist if you have not gone yet during your pregnancy. Use a soft toothbrush to brush your teeth and be gentle when you floss. Contact a health care provider  if:  You have dizziness.  You have mild pelvic cramps, pelvic pressure, or nagging pain in the abdominal area.  You have persistent nausea, vomiting, or diarrhea.  You have a bad smelling vaginal discharge.  You have pain when you urinate. Get help right away if:  You have a fever.  You are leaking fluid from your vagina.  You have spotting or bleeding from your vagina.  You have severe abdominal cramping or pain.  You have rapid weight gain or weight loss.  You have shortness of breath with chest pain.  You notice sudden or extreme swelling of your face, hands, ankles, feet, or legs.  You have not felt your baby move in over an hour.  You have severe headaches that do not go away when you take medicine.  You have vision changes. Summary  The second trimester is from week 14 through week 27 (months 4 through 6). It is also a time when the fetus is growing rapidly.  Your body goes through many changes during pregnancy. The changes vary from woman to woman.  Avoid all smoking, herbs, alcohol, and unprescribed drugs. These chemicals affect the formation and growth your baby.  Do not use any tobacco products, such as cigarettes, chewing tobacco, and e-cigarettes. If you need help quitting, ask your health care provider.  Contact your health care provider if you have any questions. Keep all prenatal visits as told by your health care provider. This is important. This information is not intended to replace advice given to you by your health care provider. Make sure you discuss any questions you have with your health care provider. Document Revised: 12/23/2018 Document Reviewed: 10/06/2016 Elsevier Patient Education  2020 [employer]   Healthy Weight Gain During Pregnancy, Adult A certain amount of weight gain during pregnancy is normal and healthy. How much weight you should gain depends on your overall health and a measurement called BMI (body mass index). BMI is an  estimate of your body fat based on your height and weight. You can use an Software engineer to figure out your BMI, or you can ask your health care provider to calculate it for you at your next visit. Your recommended pregnancy weight gain is based on your pre-pregnancy BMI. General guidelines for a healthy total weight gain during pregnancy are listed below. If your BMI at or before the start of your pregnancy is:  Less than 18.5 (underweight), you should gain 28-40 lb (13-18 kg).  18.5-24.9 (normal weight), you should gain 25-35 lb (11-16 kg).  25-29.9 (overweight), you should gain 15-25 lb (7-11 kg).  30 or higher (obese), you should gain 11-20 lb (5-9 kg). These ranges vary depending on your individual health. If you are carrying more than one baby (multiples), it may be safe to gain more weight than these recommendations. If you gain less weight than recommended, that may be safe as long as your baby is growing and developing normally. How can unhealthy weight gain affect me and my baby? Gaining too much weight during pregnancy can lead to pregnancy complications, such as:  A temporary form of diabetes that develops during pregnancy (gestational diabetes).  High blood pressure during pregnancy and protein in your urine (preeclampsia).  High blood pressure during pregnancy without protein in your urine (gestational hypertension).  Your baby having a high weight at birth, which may: ? Raise your risk of having a more difficult delivery or a surgical delivery (cesarean delivery, or C-section). ? Raise your child's risk of developing obesity during childhood. Not gaining enough weight can be life-threatening for your baby, and it may raise your baby's chances of:  Being born early (preterm).  Growing more slowly than normal during pregnancy (growth restriction).  Having a low weight at birth. What actions can I take to gain a healthy amount of weight during pregnancy? General  instructions  Keep track of your weight gain during pregnancy.  Take over-the-counter and prescription medicines only as told by your health care provider. Take all prenatal supplements as directed.  Keep all health care visits during pregnancy (prenatal visits). These visits are a good time to discuss your weight gain. Your health care provider will weigh you at each visit to make sure you are gaining a healthy amount of weight. Nutrition   Eat a balanced, nutrient-rich diet. Eat plenty of: ? Fruits and vegetables, such as berries and broccoli. ? Whole grains, such as millet, barley, whole-wheat breads and cereals, and oatmeal. ? Low-fat dairy products or non-dairy products such as almond milk or rice milk. ? Protein foods, such as lean meat, chicken, eggs, and legumes (such as peas, beans, soybeans, and lentils).  Avoid foods that are fried or have a lot of fat, salt (sodium), or sugar.  Drink enough fluid to keep your urine pale yellow.  Choose healthy snack and drink options when you are at work or on the go: ? Drink water. Avoid soda, sports drinks, and juices that have added sugar. ? Avoid drinks with caffeine, such as coffee and energy drinks. ? Eat snacks that are high in protein, such as nuts, protein bars, and low-fat yogurt. ? Carry convenient snacks in your purse that do not need refrigeration, such as a pack of trail mix, an apple, or a granola bar.  If you need help improving your diet, work with a health care provider or a diet and nutrition specialist (dietitian). Activity   Exercise regularly, as told by your health care provider. ? If you were active before becoming pregnant, you may be able to continue your regular fitness activities. ? If you were not active before pregnancy, you may gradually build up to exercising for 30 or more minutes on most days of the week. This may include walking, swimming, or yoga.  Ask your health care provider what activities are safe  for you. Talk with your health care provider about whether you may need to be excused from certain school or work activities. Where to find more information Learn more about managing your weight gain during pregnancy from:  American Pregnancy Association: www.americanpregnancy.org  [place] Department [employer] pregnancy weight gain calculator: https://ball-collins.biz/ Summary  Too much weight gain during pregnancy can lead to complications for you and your baby.  Find out your pre-pregnancy BMI to determine how much weight gain is healthy for you.  Eat nutritious foods and stay active.  Keep all of your prenatal visits as told by your health care provider. This information is not intended to replace advice given to you by your health care provider. Make sure you discuss any questions you have with your health care provider. Document Revised: 05/24/2019 Document Reviewed: 05/21/2017 Elsevier  Patient Education  [employer].

## 2020-05-15 NOTE — Progress Notes (Signed)
INITIAL OBSTETRICAL VISIT Patient name: Leah Martin MRN [id number]  Date of birth: [birth year] Chief Complaint:   Initial Prenatal Visit  History of Present Illness:   Leah Martin is a 24 y.o. G2P0010 Hispanic female at [redacted]w[redacted]d by U/S with an Estimated Date of Delivery: 12/02/20 being seen today for her initial obstetrical visit.  Her obstetrical history is significant for h/o failed pregnancy (failed Cytotec resulting in D&C). This is an unplanned pregnancy. She and the father of the baby (FOB) "Clayburn Pert" live together. She has a support system that consists of the FOB/her mother/father/friends. Today she reports no complaints.   Patient's last menstrual period was 02/26/2020 (exact date). Last pap n/a. Results were: n/a Review of Systems:   Pertinent items are noted in HPI Denies cramping/contractions, leakage of fluid, vaginal bleeding, abnormal vaginal discharge w/ itching/odor/irritation, headaches, visual changes, shortness of breath, chest pain, abdominal pain, severe nausea/vomiting, or problems with urination or bowel movements unless otherwise stated above.  Pertinent History Reviewed:  Reviewed past medical,surgical, social, obstetrical and family history.  Reviewed problem list, medications and allergies. OB History  Gravida Para Term Preterm AB Living  2       1    SAB TAB Ectopic Multiple Live Births  1            # Outcome Date GA Lbr Len/2nd Weight Sex Delivery Anes PTL Lv  2 Current           1 SAB 07/2019           Physical Assessment:   Vitals:   05/15/20 1501  BP: 112/73  Pulse: 94  Temp: 98.4 F (36.9 C)  Weight: 118 lb 3.2 oz (53.6 kg)  Body mass index is 21.62 kg/m.       Physical Examination:  General appearance - well appearing, and in no distress  Mental status - alert, oriented to person, place, and time  Psych:  She has a normal mood and affect  Skin - warm and dry, normal color, no suspicious lesions noted  Chest - effort normal, all  lung fields clear to auscultation bilaterally  Heart - normal rate and regular rhythm  Abdomen - soft, nontender  Extremities:  No swelling or varicosities noted  Pelvic - VULVA: normal appearing vulva with no masses, tenderness or lesions  VAGINA: normal appearing vagina with normal color and discharge, no lesions.   CERVIX: normal appearing cervix without discharge or lesions, no CMT  Thin prep pap is done with reflex HR HPV cotesting   FHTs by doppler: 156 bpm  Assessment & Plan:  1) Low-Risk Pregnancy G2P0010 at [redacted]w[redacted]d with an Estimated Date of Delivery: 12/02/20   2) Initial OB visit - Welcomed to practice and introduced self to patient in addition to discussing other advanced practice providers that she may be seeing at this practice - Congratulated patient - Anticipatory guidance on upcoming appointments - Educated on COVID19 and pregnancy and the integration of virtual appointments  - Educated on babyscripts app- patient reports she has not received email, encouraged to look in spam folder and to call office if she still has not received email - patient verbalizes understanding    3) Supervision of other normal pregnancy, antepartum - [place] MFM OB COMP + 14 WK; Future - CBC/D/Plt+RPR+Rh+ABO+Rub Ab... - Culture, OB Urine - Cytology - PAP( [facility]) - Cervicovaginal ancillary only( [facility])    Meds: No orders of the defined types were placed in this encounter.  Initial labs obtained Continue prenatal vitamins Reviewed n/v relief measures and warning s/s to report Reviewed recommended weight gain based on pre-gravid BMI Encouraged well-balanced diet Genetic Screening discussed: requested Cystic fibrosis, SMA, Fragile X screening discussed requested The nature of [facility] with multiple MDs and other Advanced Practice Providers was explained to patient; also emphasized that residents, students are part of our team.  Discussed  optimized OB schedule and video visits. Advised can have an in-office visit whenever she feels she needs to be seen.  Does not have own BP cuff. BP cuff Rx faxed today. Explained to patient that BP will be mailed to her house. Check BP weekly, let us know if >140/90. Advised to call during normal business hours and there is an after-hours nurse line available.    Follow-up: Return in about 4 weeks (around 06/12/2020) for Return OB - My Chart video.   Orders Placed This Encounter  Procedures  . Culture, OB Urine  . [place] MFM OB COMP + 14 WK  . CBC/D/Plt+RPR+Rh+ABO+Rub Ab...    Raelyn Mora MSN, CNM 05/15/2020

## 2020-05-16 LAB — CBC/D/PLT+RPR+RH+ABO+RUB AB...
Antibody Screen: NEGATIVE
Basophils Absolute: 0 10*3/uL (ref 0.0–0.2)
Basos: 0 %
EOS (ABSOLUTE): 0 10*3/uL (ref 0.0–0.4)
Eos: 0 %
HCV Ab: 0.2 s/co ratio (ref 0.0–0.9)
HIV Screen 4th Generation wRfx: NONREACTIVE
Hematocrit: 36 % (ref 34.0–46.6)
Hemoglobin: 12.8 g/dL (ref 11.1–15.9)
Hepatitis B Surface Ag: NEGATIVE
Immature Grans (Abs): 0 10*3/uL (ref 0.0–0.1)
Immature Granulocytes: 0 %
Lymphocytes Absolute: 1.6 10*3/uL (ref 0.7–3.1)
Lymphs: 21 %
MCH: 32.3 pg (ref 26.6–33.0)
MCHC: 35.6 g/dL (ref 31.5–35.7)
MCV: 91 fL (ref 79–97)
Monocytes Absolute: 0.5 10*3/uL (ref 0.1–0.9)
Monocytes: 7 %
Neutrophils Absolute: 5.4 10*3/uL (ref 1.4–7.0)
Neutrophils: 72 %
Platelets: 219 10*3/uL (ref 150–450)
RBC: 3.96 x10E6/uL (ref 3.77–5.28)
RDW: 12.7 % (ref 11.7–15.4)
RPR Ser Ql: NONREACTIVE
Rh Factor: POSITIVE
Rubella Antibodies, IGG: 3.29 index (ref >0.99)
WBC: 7.5 10*3/uL (ref 3.4–10.8)

## 2020-05-16 LAB — CERVICOVAGINAL ANCILLARY ONLY
Bacterial Vaginitis (gardnerella): NEGATIVE
Candida Glabrata: NEGATIVE
Candida Vaginitis: POSITIVE — AB
Chlamydia: NEGATIVE
Comment: NEGATIVE
Comment: NEGATIVE
Comment: NEGATIVE
Comment: NEGATIVE
Comment: NEGATIVE
Comment: NORMAL
Neisseria Gonorrhea: NEGATIVE
Trichomonas: NEGATIVE

## 2020-05-16 LAB — HCV INTERPRETATION

## 2020-05-17 ENCOUNTER — Telehealth: Payer: Self-pay | Admitting: *Deleted

## 2020-05-17 ENCOUNTER — Other Ambulatory Visit: Payer: Self-pay | Admitting: Obstetrics and Gynecology

## 2020-05-17 DIAGNOSIS — B379 Candidiasis, unspecified: Secondary | ICD-10-CM

## 2020-05-17 DIAGNOSIS — R87612 Low grade squamous intraepithelial lesion on cytologic smear of cervix (LGSIL): Secondary | ICD-10-CM

## 2020-05-17 LAB — CYTOLOGY - PAP

## 2020-05-17 MED ORDER — TERCONAZOLE 0.4 % VA CREA: 1.0000 | TOPICAL_CREAM | Freq: Every day | VAGINAL | 0 refills | Status: DC

## 2020-05-17 NOTE — Telephone Encounter (Signed)
-----   Message from Raelyn Mora, [place] sent at 05/16/2020  4:28 PM EDT ----- Treat for yeast

## 2020-05-19 ENCOUNTER — Other Ambulatory Visit: Payer: Self-pay | Admitting: Obstetrics and Gynecology

## 2020-05-19 DIAGNOSIS — O2341 Unspecified infection of urinary tract in pregnancy, first trimester: Secondary | ICD-10-CM

## 2020-05-19 MED ORDER — NITROFURANTOIN MONOHYD MACRO 100 MG PO CAPS: 100.0000 mg | ORAL_CAPSULE | Freq: Two times a day (BID) | ORAL | 0 refills | Status: DC

## 2020-05-20 LAB — URINE CULTURE, OB REFLEX

## 2020-05-20 LAB — CULTURE, OB URINE

## 2020-06-13 ENCOUNTER — Encounter: Payer: Self-pay | Admitting: Medical

## 2020-06-13 ENCOUNTER — Telehealth (INDEPENDENT_AMBULATORY_CARE_PROVIDER_SITE_OTHER): Payer: Self-pay | Admitting: Medical

## 2020-06-13 VITALS — BP 114/72 | HR 86

## 2020-06-13 DIAGNOSIS — Z3480 Encounter for supervision of other normal pregnancy, unspecified trimester: Secondary | ICD-10-CM

## 2020-06-13 DIAGNOSIS — R87612 Low grade squamous intraepithelial lesion on cytologic smear of cervix (LGSIL): Secondary | ICD-10-CM

## 2020-06-13 DIAGNOSIS — O3442 Maternal care for other abnormalities of cervix, second trimester: Secondary | ICD-10-CM

## 2020-06-13 DIAGNOSIS — Z3A15 15 weeks gestation of pregnancy: Secondary | ICD-10-CM

## 2020-06-13 NOTE — Progress Notes (Signed)
I connected with Leah Martin 06/13/20 at  3:50 PM EDT by: MyChart video and verified that I am speaking with the correct person using two identifiers.  Patient is located at home and provider is located at [facility].     The purpose of this virtual visit is to provide medical care while limiting exposure to the novel coronavirus. I discussed the limitations, risks, security and privacy concerns of performing an evaluation and management service by MyChart video and the availability of in person appointments. I also discussed with the patient that there may be a patient responsible charge related to this service. By engaging in this virtual visit, you consent to the provision of healthcare.  Additionally, you authorize for your insurance to be billed for the services provided during this visit.  The patient expressed understanding and agreed to proceed.  The following staff members participated in the virtual visit:  Dorisann Frames, RN    PRENATAL VISIT NOTE  Subjective:  Leah Martin is a 24 y.o. G2P0010 at [redacted]w[redacted]d  for phone visit for ongoing prenatal care.  She is currently monitored for the following issues for this low-risk pregnancy and has Retained products of conception after miscarriage; Fever of unknown origin; Supervision of other normal pregnancy, antepartum; and LGSIL on Pap smear of cervix on their problem list.  Patient reports vaginal irritation.  Contractions: Not present. Vag. Bleeding: None.  Movement: Absent. Denies leaking of fluid.   The following portions of the patient's history were reviewed and updated as appropriate: allergies, current medications, past family history, past medical history, past social history, past surgical history and problem list.   Objective:   Vitals:   06/13/20 1556  BP: 114/72  Pulse: 86   Self-Obtained  Fetal Status:     Movement: Absent     Assessment and Plan:  Pregnancy: G2P0010 at [redacted]w[redacted]d 1. Supervision of other normal  pregnancy, antepartum - Doing well - Anatomy US scheduled 10/27  2. LGSIL on Pap smear of cervix - Needs repeat pap in 1 year  3. Yeast vulvovaginitis  - Patient reports burning with Terazol  - Still having discharge - Recommend Monistat first and then consider Diflucan is similar reaction to Terazol   Preterm labor symptoms and general obstetric precautions including but not limited to vaginal bleeding, contractions, leaking of fluid and fetal movement were reviewed in detail with the patient.  Return in about 4 weeks (around 07/11/2020) for LOB, In-Person.  Future Appointments  Date Time Provider Department Center  07/10/2020 12:45 PM [facility] US4 [facility]     Time spent on virtual visit: 10 minutes  Vonzella Nipple, PA-C

## 2020-06-13 NOTE — Patient Instructions (Signed)
Vaginal Yeast Infection, Adult  Vaginal yeast infection is a condition that causes vaginal discharge as well as soreness, swelling, and redness (inflammation) of the vagina. This is a common condition. Some women get this infection frequently. What are the causes? This condition is caused by a change in the normal balance of the yeast (candida) and bacteria that live in the vagina. This change causes an overgrowth of yeast, which causes the inflammation. What increases the risk? The condition is more likely to develop in women who:  Take antibiotic medicines.  Have diabetes.  Take birth control pills.  Are pregnant.  Douche often.  Have a weak body defense system (immune system).  Have been taking steroid medicines for a long time.  Frequently wear tight clothing. What are the signs or symptoms? Symptoms of this condition include:  White, thick, creamy vaginal discharge.  Swelling, itching, redness, and irritation of the vagina. The lips of the vagina (vulva) may be affected as well.  Pain or a burning feeling while urinating.  Pain during sex. How is this diagnosed? This condition is diagnosed based on:  Your medical history.  A physical exam.  A pelvic exam. Your health care provider will examine a sample of your vaginal discharge under a microscope. Your health care provider may send this sample for testing to confirm the diagnosis. How is this treated? This condition is treated with medicine. Medicines may be over-the-counter or prescription. You may be told to use one or more of the following:  Medicine that is taken by mouth (orally).  Medicine that is applied as a cream (topically).  Medicine that is inserted directly into the vagina (suppository). Follow these instructions at home:  Lifestyle  Do not have sex until your health care provider approves. Tell your sex partner that you have a yeast infection. That person should go to his or her health care  provider and ask if they should also be treated.  Do not wear tight clothes, such as pantyhose or tight pants.  Wear breathable cotton underwear. General instructions  Take or apply over-the-counter and prescription medicines only as told by your health care provider.  Eat more yogurt. This may help to keep your yeast infection from returning.  Do not use tampons until your health care provider approves.  Try taking a sitz bath to help with discomfort. This is a warm water bath that is taken while you are sitting down. The water should only come up to your hips and should cover your buttocks. Do this 3-4 times per day or as told by your health care provider.  Do not douche.  If you have diabetes, keep your blood sugar levels under control.  Keep all follow-up visits as told by your health care provider. This is important. Contact a health care provider if:  You have a fever.  Your symptoms go away and then return.  Your symptoms do not get better with treatment.  Your symptoms get worse.  You have new symptoms.  You develop blisters in or around your vagina.  You have blood coming from your vagina and it is not your menstrual period.  You develop pain in your abdomen. Summary  Vaginal yeast infection is a condition that causes discharge as well as soreness, swelling, and redness (inflammation) of the vagina.  This condition is treated with medicine. Medicines may be over-the-counter or prescription.  Take or apply over-the-counter and prescription medicines only as told by your health care provider.  Do not douche.   Do not have sex or use tampons until your health care provider approves.  Contact a health care provider if your symptoms do not get better with treatment or your symptoms go away and then return. This information is not intended to replace advice given to you by your health care provider. Make sure you discuss any questions you have with your health care  provider. Document Revised: 03/31/2019 Document Reviewed: 01/17/2018 Elsevier Patient Education  2020 [employer]. Second Trimester of Pregnancy  The second trimester is from week 14 through week 27 (month 4 through 6). This is often the time in pregnancy that you feel your best. Often times, morning sickness has lessened or quit. You may have more energy, and you may get hungry more often. Your unborn baby is growing rapidly. At the end of the sixth month, he or she is about 9 inches long and weighs about 1 pounds. You will likely feel the baby move between 18 and 20 weeks of pregnancy. Follow these instructions at home: Medicines  Take over-the-counter and prescription medicines only as told by your doctor. Some medicines are safe and some medicines are not safe during pregnancy.  Take a prenatal vitamin that contains at least 600 micrograms (mcg) of folic acid.  If you have trouble pooping (constipation), take medicine that will make your stool soft (stool softener) if your doctor approves. Eating and drinking   Eat regular, healthy meals.  Avoid raw meat and uncooked cheese.  If you get low calcium from the food you eat, talk to your doctor about taking a daily calcium supplement.  Avoid foods that are high in fat and sugars, such as fried and sweet foods.  If you feel sick to your stomach (nauseous) or throw up (vomit): ? Eat 4 or 5 small meals a day instead of 3 large meals. ? Try eating a few soda crackers. ? Drink liquids between meals instead of during meals.  To prevent constipation: ? Eat foods that are high in fiber, like fresh fruits and vegetables, whole grains, and beans. ? Drink enough fluids to keep your pee (urine) clear or pale yellow. Activity  Exercise only as told by your doctor. Stop exercising if you start to have cramps.  Do not exercise if it is too hot, too humid, or if you are in a place of great height (high altitude).  Avoid heavy  lifting.  Wear low-heeled shoes. Sit and stand up straight.  You can continue to have sex unless your doctor tells you not to. Relieving pain and discomfort  Wear a good support bra if your breasts are tender.  Take warm water baths (sitz baths) to soothe pain or discomfort caused by hemorrhoids. Use hemorrhoid cream if your doctor approves.  Rest with your legs raised if you have leg cramps or low back pain.  If you develop puffy, bulging veins (varicose veins) in your legs: ? Wear support hose or compression stockings as told by your doctor. ? Raise (elevate) your feet for 15 minutes, 3-4 times a day. ? Limit salt in your food. Prenatal care  Write down your questions. Take them to your prenatal visits.  Keep all your prenatal visits as told by your doctor. This is important. Safety  Wear your seat belt when driving.  Make a list of emergency phone numbers, including numbers for family, friends, the hospital, and police and fire departments. General instructions  Ask your doctor about the right foods to eat or for help finding a  counselor, if you need these services.  Ask your doctor about local prenatal classes. Begin classes before month 6 of your pregnancy.  Do not use hot tubs, steam rooms, or saunas.  Do not douche or use tampons or scented sanitary pads.  Do not cross your legs for long periods of time.  Visit your dentist if you have not done so. Use a soft toothbrush to brush your teeth. Floss gently.  Avoid all smoking, herbs, and alcohol. Avoid drugs that are not approved by your doctor.  Do not use any products that contain nicotine or tobacco, such as cigarettes and e-cigarettes. If you need help quitting, ask your doctor.  Avoid cat litter boxes and soil used by cats. These carry germs that can cause birth defects in the baby and can cause a loss of your baby (miscarriage) or stillbirth. Contact a doctor if:  You have mild cramps or pressure in your  lower belly.  You have pain when you pee (urinate).  You have bad smelling fluid coming from your vagina.  You continue to feel sick to your stomach (nauseous), throw up (vomit), or have watery poop (diarrhea).  You have a nagging pain in your belly area.  You feel dizzy. Get help right away if:  You have a fever.  You are leaking fluid from your vagina.  You have spotting or bleeding from your vagina.  You have severe belly cramping or pain.  You lose or gain weight rapidly.  You have trouble catching your breath and have chest pain.  You notice sudden or extreme puffiness (swelling) of your face, hands, ankles, feet, or legs.  You have not felt the baby move in over an hour.  You have severe headaches that do not go away when you take medicine.  You have trouble seeing. Summary  The second trimester is from week 14 through week 27 (months 4 through 6). This is often the time in pregnancy that you feel your best.  To take care of yourself and your unborn baby, you will need to eat healthy meals, take medicines only if your doctor tells you to do so, and do activities that are safe for you and your baby.  Call your doctor if you get sick or if you notice anything unusual about your pregnancy. Also, call your doctor if you need help with the right food to eat, or if you want to know what activities are safe for you. This information is not intended to replace advice given to you by your health care provider. Make sure you discuss any questions you have with your health care provider. Document Revised: 12/23/2018 Document Reviewed: 10/06/2016 Elsevier Patient Education  2020 [employer].

## 2020-06-26 IMAGING — US US OB TRANSVAGINAL
1 series · 15 of 28 positions shown · non-contrast
Comparison: 07/03/2019 obstetric scan.

CLINICAL DATA: 23-year-old pregnant female presents for assessment
of fetal dating and viability. Uncertain LMP.

EXAM:
TRANSVAGINAL OB ULTRASOUND
TECHNIQUE: Transvaginal ultrasound was performed for complete evaluation of the
gestation as well as the maternal uterus, adnexal regions, and
pelvic cul-de-sac.

[Series 1: us ob transvaginal · 32 acquisitions, 15 frames shown]
[im 1/32]
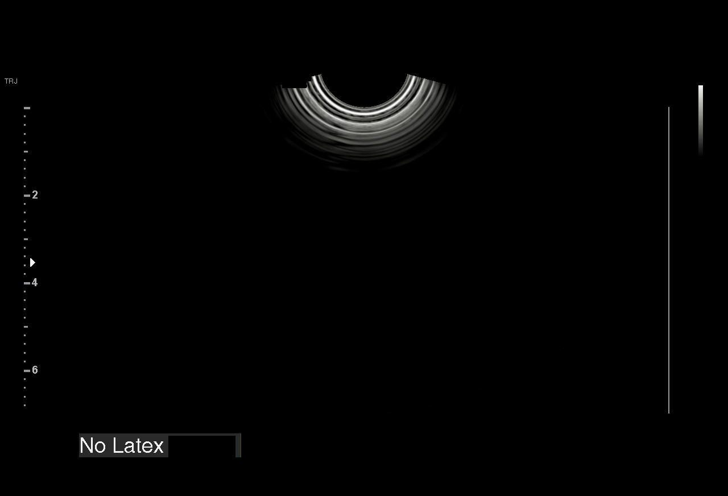
[im 3/32]
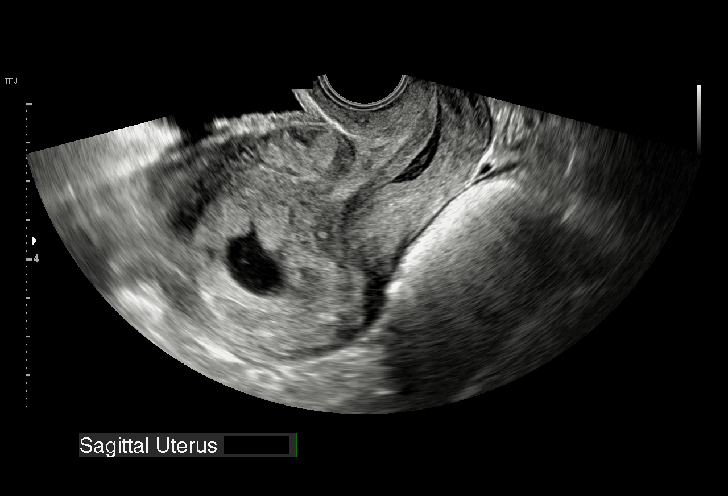
[im 5/32]
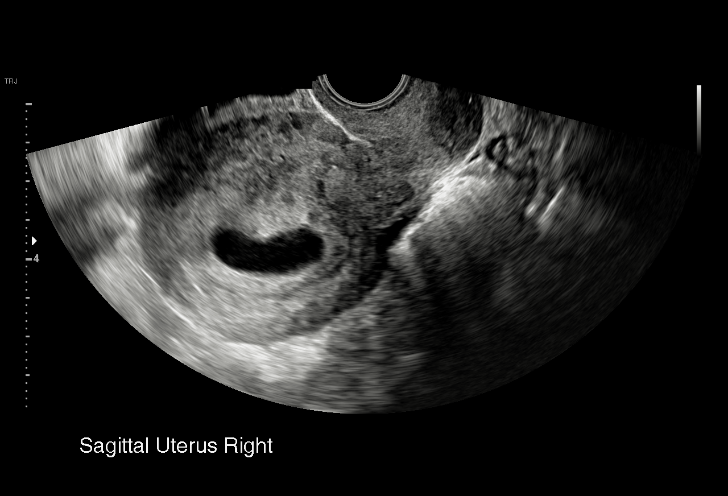
[im 7/32]
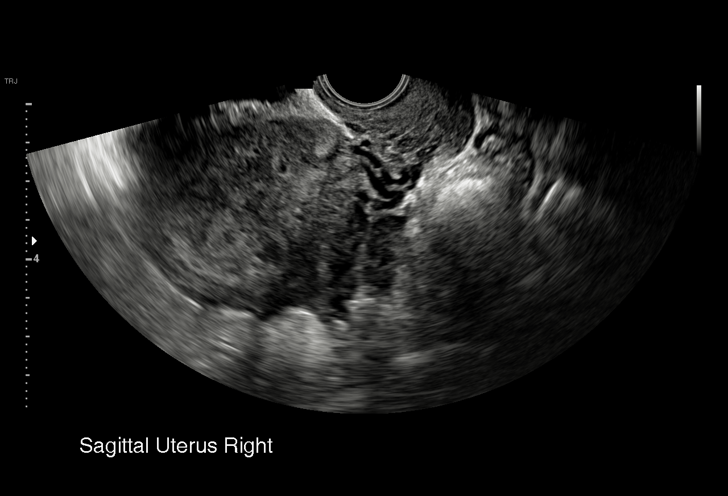
[im 10/32]
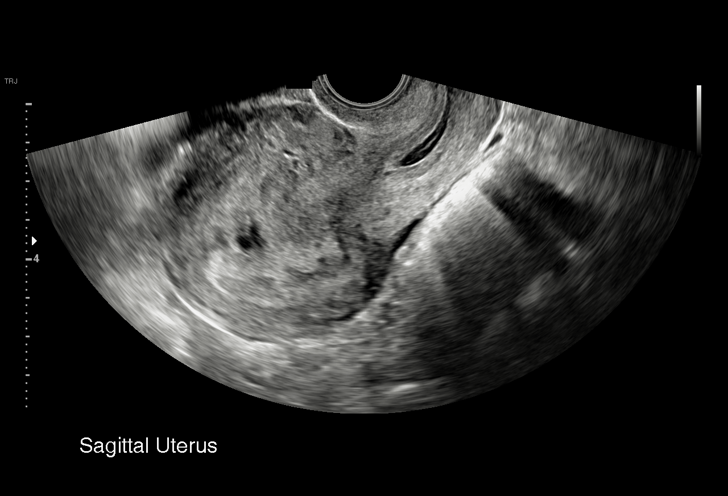
[im 12/32]
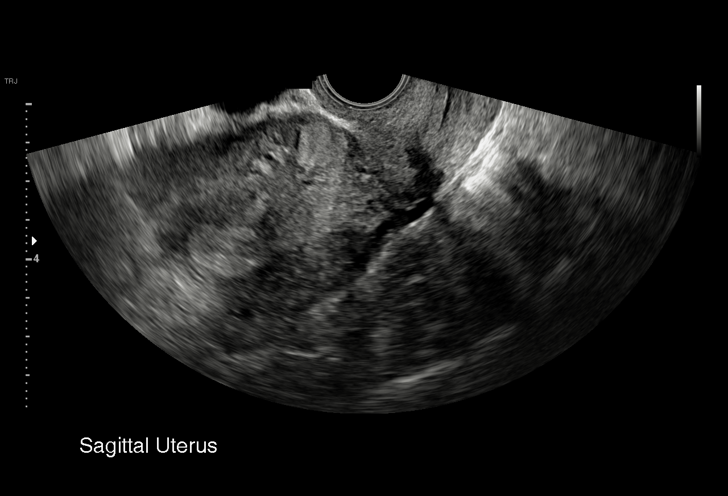
[im 14/32]
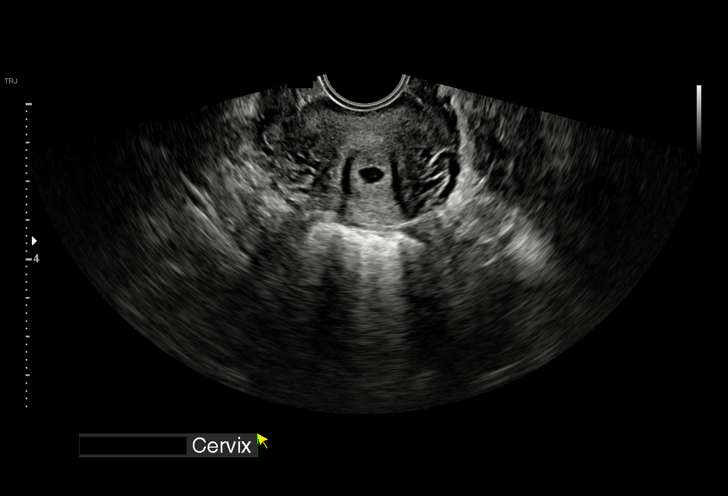
[im 17/32]
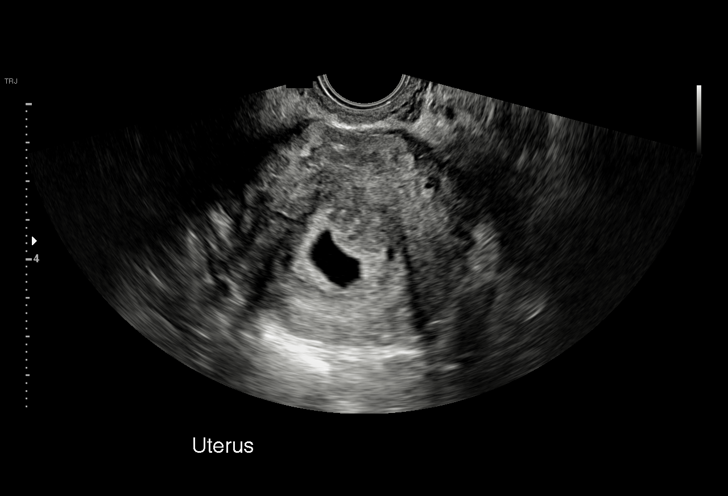
[im 18/32]
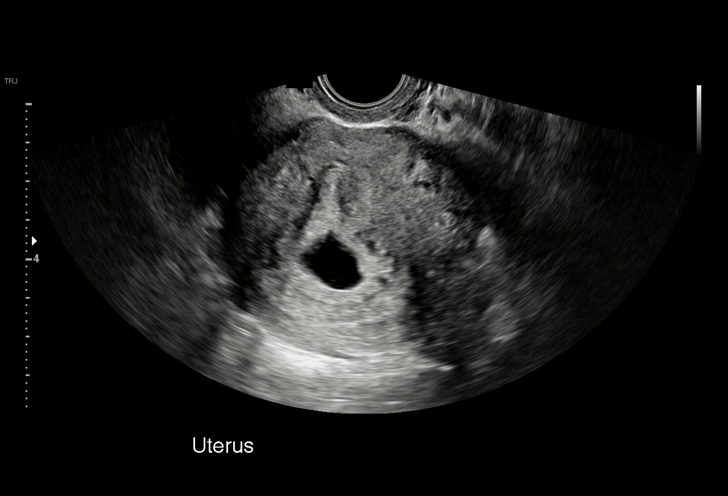
[im 20/32]
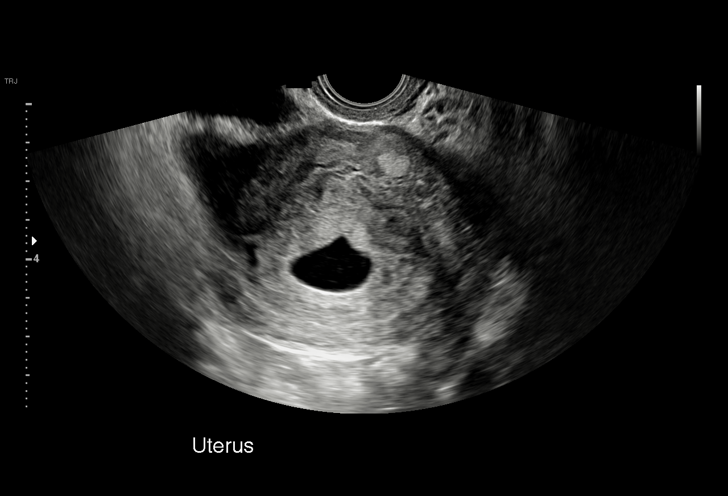
[im 22/32]
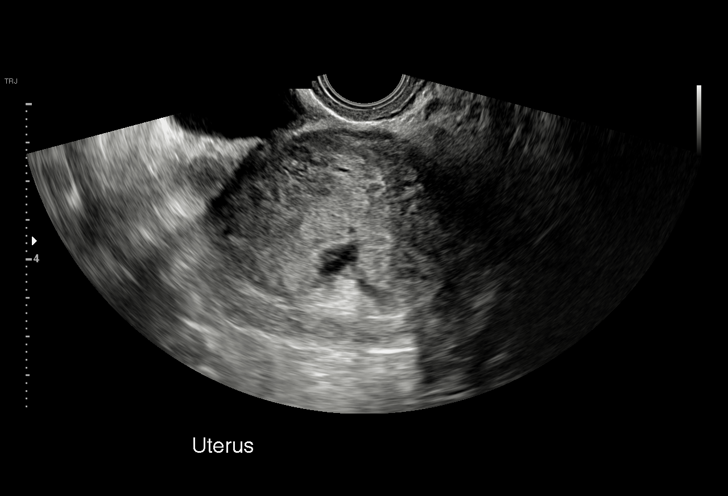
[im 25/32]
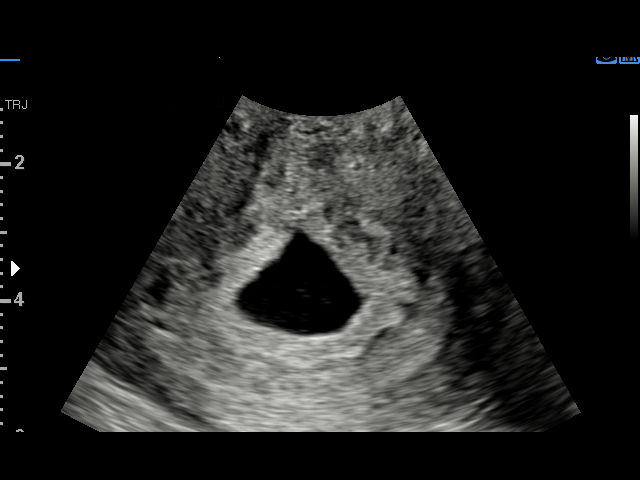
[im 27/32]
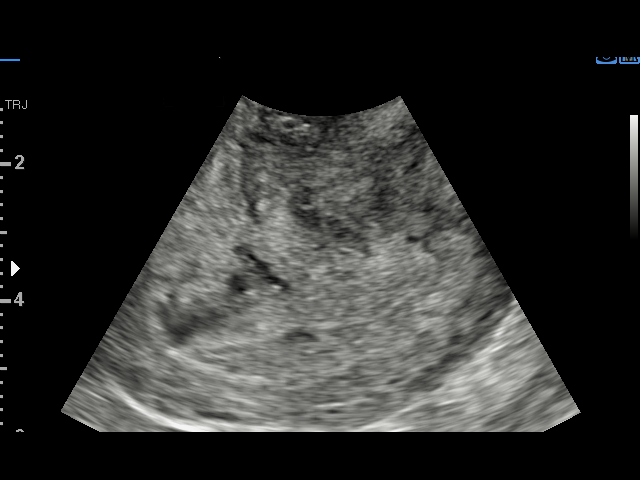
[im 29/32]
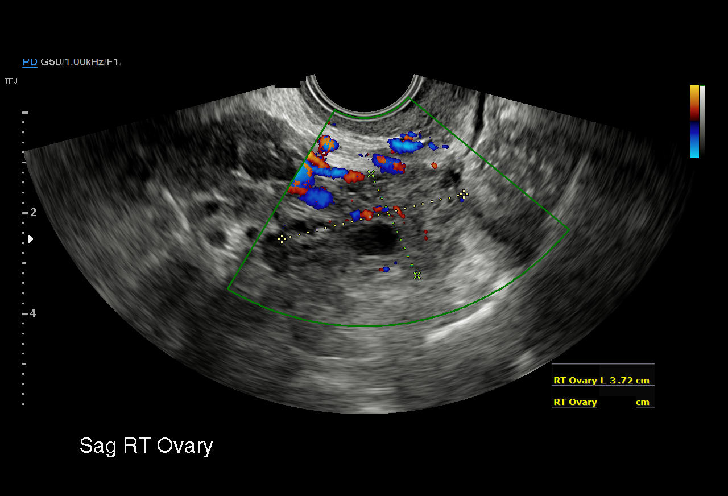
[im 32/32]
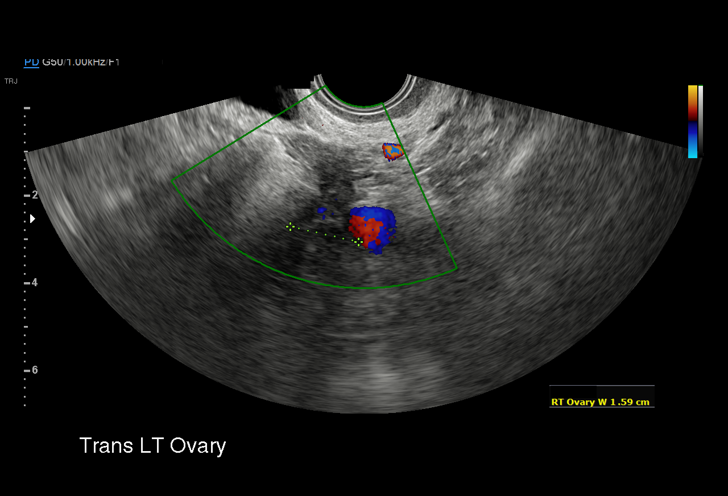

[15 of 28 positions shown; findings below may reference images not displayed]

FINDINGS: Intrauterine gestational sac: Single irregular intrauterine sac-like
structure with minimal heterogeneous internal echoes.

Yolk sac:  Not Visualized.

Embryo:  Not Visualized.

Cardiac Activity: Not Visualized.

MSD: 16.8 mm   7 w   2 d

Subchorionic hemorrhage:  None visualized.

Maternal uterus/adnexae: Right ovary measures 3.7 x 2.2 x 2.8 cm.
Left ovary measures 3.2 x 2.1 x 1.6 cm. No abnormal ovarian or
adnexal masses. No abnormal free fluid in the pelvis. Anteverted
uterus with no uterine fibroids demonstrated. Small amount of fluid
noted in the endocervical canal.
IMPRESSION: 1. Single irregular intrauterine sac-like structure with
heterogeneous internal echoes, decreased in size since 07/03/2019
scan. Previously demonstrated yolk sac is not visualized on today's
scan. No embryo. Findings meet definitive criteria for nonviable
pregnancy. This follows SRU consensus guidelines: Diagnostic
Criteria for Nonviable Pregnancy Early in the First Trimester. N
Engl J Med 5088;[DATE].
2. No ovarian or adnexal abnormality.

## 2020-06-27 ENCOUNTER — Encounter: Payer: Self-pay | Admitting: Medical

## 2020-07-10 ENCOUNTER — Ambulatory Visit: Payer: Medicaid Other | Attending: Obstetrics and Gynecology

## 2020-07-10 ENCOUNTER — Encounter: Payer: Medicaid Other | Admitting: Certified Nurse Midwife

## 2020-07-10 ENCOUNTER — Other Ambulatory Visit: Payer: Self-pay

## 2020-07-10 DIAGNOSIS — Z3480 Encounter for supervision of other normal pregnancy, unspecified trimester: Secondary | ICD-10-CM | POA: Insufficient documentation

## 2020-07-10 DIAGNOSIS — Z363 Encounter for antenatal screening for malformations: Secondary | ICD-10-CM

## 2020-07-10 DIAGNOSIS — Z3A19 19 weeks gestation of pregnancy: Secondary | ICD-10-CM

## 2020-07-25 ENCOUNTER — Encounter: Payer: Self-pay | Admitting: Obstetrics and Gynecology

## 2020-07-25 ENCOUNTER — Other Ambulatory Visit: Payer: Self-pay

## 2020-07-25 ENCOUNTER — Ambulatory Visit (INDEPENDENT_AMBULATORY_CARE_PROVIDER_SITE_OTHER): Payer: Medicaid Other | Admitting: Obstetrics and Gynecology

## 2020-07-25 VITALS — BP 114/71 | HR 85 | Temp 97.9°F | Wt 132.2 lb

## 2020-07-25 DIAGNOSIS — Z3A21 21 weeks gestation of pregnancy: Secondary | ICD-10-CM

## 2020-07-25 DIAGNOSIS — Z23 Encounter for immunization: Secondary | ICD-10-CM

## 2020-07-25 DIAGNOSIS — Z3480 Encounter for supervision of other normal pregnancy, unspecified trimester: Secondary | ICD-10-CM | POA: Diagnosis not present

## 2020-07-25 NOTE — Progress Notes (Signed)
   LOW-RISK PREGNANCY OFFICE VISIT Patient name: Leah Martin MRN [id number]  Date of birth: Sep 26, 1995 Chief Complaint:   Routine Prenatal Visit  History of Present Illness:   Leah Martin is a 24 y.o. G2P0010 female at [redacted]w[redacted]d with an Estimated Date of Delivery: 12/02/20 being seen today for ongoing management of a low-risk pregnancy.  Today she reports no complaints. Contractions: Not present. Vag. Bleeding: None.  Movement: Absent. denies leaking of fluid. Review of Systems:   Pertinent items are noted in HPI Denies abnormal vaginal discharge w/ itching/odor/irritation, headaches, visual changes, shortness of breath, chest pain, abdominal pain, severe nausea/vomiting, or problems with urination or bowel movements unless otherwise stated above. Pertinent History Reviewed:  Reviewed past medical,surgical, social, obstetrical and family history.  Reviewed problem list, medications and allergies. Physical Assessment:   Vitals:   07/25/20 1602  BP: 114/71  Pulse: 85  Temp: 97.9 F (36.6 C)  Weight: 132 lb 3.2 oz (60 kg)  Body mass index is 24.18 kg/m.        Physical Examination:   General appearance: Well appearing, and in no distress  Mental status: Alert, oriented to person, place, and time  Skin: Warm & dry  Cardiovascular: Normal heart rate noted  Respiratory: Normal respiratory effort, no distress  Abdomen: Soft, gravid, nontender  Pelvic: Cervical exam deferred         Extremities: Edema: None  Fetal Status: Fetal Heart Rate (bpm): 145   Movement: Absent    No results found for this or any previous visit (from the past 24 hour(s)).  Assessment & Plan:  1) Low-risk pregnancy G2P0010 at [redacted]w[redacted]d with an Estimated Date of Delivery: 12/02/20   2) Supervision of other normal pregnancy, antepartum - Declines AFP - Flu vaccine today  3) [redacted] weeks gestation of pregnancy    Meds: No orders of the defined types were placed in this encounter.  Labs/procedures today:  flu vaccine  Plan:  Continue routine obstetrical care   Reviewed: Preterm labor symptoms and general obstetric precautions including but not limited to vaginal bleeding, contractions, leaking of fluid and fetal movement were reviewed in detail with the patient.  All questions were answered. Has home bp cuff. Check bp weekly, let Leah Martin know if >140/90.   Follow-up: Return in about 4 weeks (around 08/22/2020) for Return OB - My Chart video.  No orders of the defined types were placed in this encounter.  Raelyn Mora MSN, CNM 07/25/2020 4:14 PM

## 2020-08-12 ENCOUNTER — Encounter (HOSPITAL_COMMUNITY): Payer: Self-pay | Admitting: Obstetrics and Gynecology

## 2020-08-12 ENCOUNTER — Other Ambulatory Visit: Payer: Self-pay

## 2020-08-12 ENCOUNTER — Inpatient Hospital Stay (HOSPITAL_COMMUNITY)
Admission: AD | Admit: 2020-08-12 | Discharge: 2020-08-12 | Disposition: A | Discharge status: Home / Self Care | Payer: Medicaid Other | Attending: Obstetrics and Gynecology | Admitting: Obstetrics and Gynecology

## 2020-08-12 DIAGNOSIS — R1030 Lower abdominal pain, unspecified: Secondary | ICD-10-CM | POA: Insufficient documentation

## 2020-08-12 DIAGNOSIS — R109 Unspecified abdominal pain: Secondary | ICD-10-CM | POA: Diagnosis not present

## 2020-08-12 DIAGNOSIS — B373 Candidiasis of vulva and vagina: Secondary | ICD-10-CM | POA: Diagnosis not present

## 2020-08-12 DIAGNOSIS — O26892 Other specified pregnancy related conditions, second trimester: Secondary | ICD-10-CM | POA: Diagnosis not present

## 2020-08-12 DIAGNOSIS — Z3A24 24 weeks gestation of pregnancy: Secondary | ICD-10-CM | POA: Diagnosis not present

## 2020-08-12 DIAGNOSIS — O98812 Other maternal infectious and parasitic diseases complicating pregnancy, second trimester: Secondary | ICD-10-CM | POA: Diagnosis not present

## 2020-08-12 DIAGNOSIS — B379 Candidiasis, unspecified: Secondary | ICD-10-CM

## 2020-08-12 DIAGNOSIS — Z79899 Other long term (current) drug therapy: Secondary | ICD-10-CM | POA: Insufficient documentation

## 2020-08-12 DIAGNOSIS — O26899 Other specified pregnancy related conditions, unspecified trimester: Secondary | ICD-10-CM

## 2020-08-12 DIAGNOSIS — Z3480 Encounter for supervision of other normal pregnancy, unspecified trimester: Secondary | ICD-10-CM

## 2020-08-12 DIAGNOSIS — B3731 Acute candidiasis of vulva and vagina: Secondary | ICD-10-CM

## 2020-08-12 LAB — URINALYSIS, ROUTINE W REFLEX MICROSCOPIC
Bilirubin Urine: NEGATIVE
Glucose, UA: NEGATIVE mg/dL
Hgb urine dipstick: NEGATIVE
Ketones, ur: NEGATIVE mg/dL
Nitrite: NEGATIVE
Protein, ur: NEGATIVE mg/dL
Specific Gravity, Urine: 1.006 (ref 1.005–1.030)
pH: 7 (ref 5.0–8.0)

## 2020-08-12 LAB — WET PREP, GENITAL
Clue Cells Wet Prep HPF POC: NONE SEEN
Sperm: NONE SEEN
Trich, Wet Prep: NONE SEEN

## 2020-08-12 LAB — POCT FERN TEST: POCT Fern Test: NEGATIVE

## 2020-08-12 MED ORDER — TERCONAZOLE 0.4 % VA CREA: 1.0000 | TOPICAL_CREAM | Freq: Every day | VAGINAL | 0 refills | Status: DC

## 2020-08-12 MED ORDER — ACETAMINOPHEN 325 MG PO TABS
650.0000 mg | ORAL_TABLET | Freq: Once | ORAL | Status: AC
  Administered 2020-08-12: 650 mg via ORAL
  Filled 2020-08-12: qty 2

## 2020-08-12 NOTE — MAU Provider Note (Signed)
History     CSN: [id number]  Arrival date and time: 08/12/20 2133   First Provider Initiated Contact with Patient 08/12/20 2210      Chief Complaint  Patient presents with  . Abdominal Pain  . Vaginal Discharge   Leah Martin is a 24 y.o. G2P0 at [redacted]w[redacted]d who presents to MAU with complaints of abdominal pain and vaginal discharge. Patient reports lower abdominal cramping that starts at umbilicus and moves to pelvis over the past 24 hours. Patient rates pain 6/10- has not taken any medication for pain. Patient reports that when she stands up it feels that baby is more in her pelvis then when she lays down. Reports that when she lays down "it feels normal". Patient denies that pain increases with standing. She also reports white watery discharge that has been occurring for the past 3 days. She denies vaginal bleeding. Denies recent IC- last IC last Tuesday. Patient reports that she feels fetal movement, "just not as much today".    OB History    Gravida  2   Para      Term      Preterm      AB  1   Living        SAB  1   TAB      Ectopic      Multiple      Live Births              Past Medical History:  Diagnosis Date  . Medical history non-contributory     Past Surgical History:  Procedure Laterality Date  . DILATION AND CURETTAGE OF UTERUS N/A 08/09/2019   Procedure: SUCTION DILATATION AND CURETTAGE;  Surgeon: Conan Bowens, MD;  Location: [facility] OR;  Service: Gynecology;  Laterality: N/A;  . NO PAST SURGERIES      Family History  Problem Relation Age of Onset  . Kidney disease Mother     Social History   Tobacco Use  . Smoking status: Never Smoker  . Smokeless tobacco: Never Used  Vaping Use  . Vaping Use: Never used  Substance Use Topics  . Alcohol use: No  . Drug use: No    Allergies: No Known Allergies  Medications Prior to Admission  Medication Sig Dispense Refill Last Dose  . Prenatal Vit-Fe Fumarate-FA (PRENATAL PLUS/IRON) 27-1 MG  TABS Take 1 tablet by mouth daily. 30 tablet 12 08/12/2020 at Unknown time  . nitrofurantoin, macrocrystal-monohydrate, (MACROBID) 100 MG capsule Take 1 capsule (100 mg total) by mouth 2 (two) times daily. 14 capsule 0   . terconazole (TERAZOL 7) 0.4 % vaginal cream Place 1 applicator vaginally at bedtime. Bedtime for 3 days. (Patient not taking: Reported on 06/13/2020) 45 g 0     Review of Systems  Constitutional: Negative.   Respiratory: Negative.   Cardiovascular: Negative.   Gastrointestinal: Positive for abdominal pain. Negative for constipation, diarrhea, nausea and vomiting.  Genitourinary: Positive for vaginal discharge. Negative for difficulty urinating, dysuria, frequency, pelvic pain, urgency and vaginal bleeding.  Musculoskeletal: Negative.   Neurological: Negative.   Psychiatric/Behavioral: Negative.    Physical Exam   Blood pressure 125/62, pulse 93, temperature 98.6 F (37 C), temperature source Oral, resp. rate 15, height 5\' 2"  (1.575 m), weight 61.2 kg, last menstrual period 02/26/2020, SpO2 100 %.  Physical Exam Vitals and nursing note reviewed. Exam conducted with a chaperone present.  HENT:     Head: Normocephalic.  Cardiovascular:     Rate and Rhythm:  Normal rate and regular rhythm.  Pulmonary:     Effort: Pulmonary effort is normal. No respiratory distress.     Breath sounds: Normal breath sounds. No wheezing.  Abdominal:     Palpations: Abdomen is soft. There is no mass.     Tenderness: There is no abdominal tenderness. There is no guarding.     Comments: Gravid appropriate for gestational age. Fetal  Movement palpated by CNM   Genitourinary:    Exam position: Lithotomy position.     Vagina: Vaginal discharge present. No bleeding.     Comments: Pelvic exam: Cervix pink, visually closed, without lesion, moderate amount of white curdy discharge patches adherent to cervix and vaginal walls, external genitalia normal Bimanual exam: Cervix 0/long/high, firm,  anterior, neg CMT  Neurological:     Mental Status: She is alert and oriented to person, place, and time.  Psychiatric:        Mood and Affect: Mood normal.        Behavior: Behavior normal.        Thought Content: Thought content normal.    MAU Course  Procedures  MDM FFN collected prior to pelvic examination  Wet prep, GC/C and fern collected   Negative pooling on examination- Fern negative  Labs reviewed:  Results for orders placed or performed during the hospital encounter of 08/12/20 (from the past 24 hour(s))  Urinalysis, Routine w reflex microscopic Urine, Clean Catch     Status: Abnormal   Collection Time: 08/12/20  9:57 PM  Result Value Ref Range   Color, Urine YELLOW YELLOW   APPearance CLEAR CLEAR   Specific Gravity, Urine 1.006 1.005 - 1.030   pH 7.0 5.0 - 8.0   Glucose, UA NEGATIVE NEGATIVE mg/dL   Hgb urine dipstick NEGATIVE NEGATIVE   Bilirubin Urine NEGATIVE NEGATIVE   Ketones, ur NEGATIVE NEGATIVE mg/dL   Protein, ur NEGATIVE NEGATIVE mg/dL   Nitrite NEGATIVE NEGATIVE   Leukocytes,Ua SMALL (A) NEGATIVE   RBC / HPF 0-5 0 - 5 RBC/hpf   WBC, UA 0-5 0 - 5 WBC/hpf   Bacteria, UA RARE (A) NONE SEEN   Squamous Epithelial / LPF 0-5 0 - 5  Wet prep, genital     Status: Abnormal   Collection Time: 08/12/20 10:27 PM  Result Value Ref Range   Yeast Wet Prep HPF POC PRESENT (A) NONE SEEN   Trich, Wet Prep NONE SEEN NONE SEEN   Clue Cells Wet Prep HPF POC NONE SEEN NONE SEEN   WBC, Wet Prep HPF POC MANY (A) NONE SEEN   Sperm NONE SEEN   POCT fern test     Status: Normal   Collection Time: 08/12/20 10:36 PM  Result Value Ref Range   POCT Fern Test Negative = intact amniotic membranes    Treatments in MAU including Tylenol 650mg  for abdominal pain.  Reassessment - patient reports pain is better after medication, educated and discussed yeast infection during pregnancy and treatment. Rx for terazol sent to pharmacy of choice.   Discussed reasons to return to MAU.  Follow up as scheduled in the office. Return to MAU as needed. Pt stable at time of discharge.   Assessment and Plan   1. Vulvovaginal candidiasis   2. Supervision of other normal pregnancy, antepartum   3. Abdominal cramping affecting pregnancy   4. [redacted] weeks gestation of pregnancy   5. Yeast infection    Discharge home Follow up as scheduled in the office for prenatal care Return to MAU  as needed for reasons discussed and/or emergencies  Rx for Terazol sent to pharmacy of choice    Follow-up Information    [facility] Follow up.   Specialty: Obstetrics and Gynecology Contact information: [address] [place] [address] [phone]             Allergies as of 08/12/2020   No Known Allergies     Medication List    STOP taking these medications   nitrofurantoin (macrocrystal-monohydrate) 100 MG capsule Commonly known as: MACROBID     TAKE these medications   Prenatal Plus/Iron 27-1 MG Tabs Take 1 tablet by mouth daily.   terconazole 0.4 % vaginal cream Commonly known as: TERAZOL 7 Place 1 applicator vaginally at bedtime. Bedtime for 3 days.       Sharyon Cable CNM 08/13/2020, 6:57 AM

## 2020-08-12 NOTE — MAU Note (Signed)
.   Leah Martin is a 24 y.o. at [redacted]w[redacted]d here in MAU reporting: lower abdominal cramping that comes goes that started yesterday morning. She also reports clear watery discharge that started x3 days ago. States that she has not felt her baby move as much since last night. No VB. Last intercourse was last Tuesday.   Pain score: 7 Vitals:   08/12/20 2140  BP: 125/62  Pulse: 93  Resp: 15  Temp: 98.6 F (37 C)  SpO2: 100%     FHT:153 Lab orders placed from triage: UA

## 2020-08-13 LAB — GC/CHLAMYDIA PROBE AMP (~~LOC~~) NOT AT ARMC
Chlamydia: NEGATIVE
Comment: NEGATIVE
Comment: NORMAL
Neisseria Gonorrhea: NEGATIVE

## 2020-08-22 ENCOUNTER — Telehealth (INDEPENDENT_AMBULATORY_CARE_PROVIDER_SITE_OTHER): Payer: Medicaid Other | Admitting: Obstetrics and Gynecology

## 2020-08-22 ENCOUNTER — Encounter: Payer: Self-pay | Admitting: Obstetrics and Gynecology

## 2020-08-22 ENCOUNTER — Other Ambulatory Visit: Payer: Self-pay

## 2020-08-22 VITALS — BP 110/66 | HR 76 | Wt 137.0 lb

## 2020-08-22 DIAGNOSIS — Z3480 Encounter for supervision of other normal pregnancy, unspecified trimester: Secondary | ICD-10-CM

## 2020-08-22 DIAGNOSIS — Z3A25 25 weeks gestation of pregnancy: Secondary | ICD-10-CM | POA: Diagnosis not present

## 2020-08-22 NOTE — Progress Notes (Signed)
   MY CHART VIDEO VIRTUAL OBSTETRICS VISIT ENCOUNTER NOTE  I connected with Sherilyn Cooter on 08/22/20 at 11:10 AM EST by My Chart video at home and verified that I am speaking with the correct person using two identifiers. Provider located at [facility] for [employer] [facility] at [place].   I discussed the limitations, risks, security and privacy concerns of performing an evaluation and management service by My Chart video and the availability of in person appointments. I also discussed with the patient that there may be a patient responsible charge related to this service. The patient expressed understanding and agreed to proceed.  Subjective:  FARRELL BROERMAN is a 24 y.o. G2P0010 at [redacted]w[redacted]d being followed for ongoing prenatal care.  She is currently monitored for the following issues for this low-risk pregnancy and has Fever of unknown origin; Supervision of other normal pregnancy, antepartum; and LGSIL on Pap smear of cervix on their problem list.  Patient reports no complaints. Reports fetal movement. Denies any contractions, bleeding or leaking of fluid.   The following portions of the patient's history were reviewed and updated as appropriate: allergies, current medications, past family history, past medical history, past social history, past surgical history and problem list.   Objective:   General:  Alert, oriented and cooperative.   Mental Status: Normal mood and affect perceived. Normal judgment and thought content.  Rest of physical exam deferred due to type of encounter  BP 110/66   Pulse 76   Wt 137 lb (62.1 kg)   LMP 02/26/2020 (Exact Date)   BMI 25.06 kg/m  **Done by patient's own at home BP cuff and scale  Assessment and Plan:  Pregnancy: G2P0010 at [redacted]w[redacted]d  1. Supervision of other normal pregnancy, antepartum - Anticipatory guidance for 2 hr GTT - advised to fast after midnight without anything to eat or drink (except for water), will have fasting blood drawn, drink  the glucola drink (flavor choices: orange, fruit punch or lemon-lime), have a visit with a provider during the first hour of testing, wait in the lab waiting room to have blood drawn at 1 hour and then 2 hours after finishing glucola drink.   2. [redacted] weeks gestation of pregnancy    Preterm labor symptoms and general obstetric precautions including but not limited to vaginal bleeding, contractions, leaking of fluid and fetal movement were reviewed in detail with the patient.  I discussed the assessment and treatment plan with the patient. The patient was provided an opportunity to ask questions and all were answered. The patient agreed with the plan and demonstrated an understanding of the instructions. The patient was advised to call back or seek an in-person office evaluation/go to MAU at [facility] for any urgent or concerning symptoms. Please refer to After Visit Summary for other counseling recommendations.   I provided 5 minutes of non-face-to-face time during this encounter. There was 5 minutes of chart review time spent prior to this encounter. Total time spent = 10 minutes.  Return in about 3 weeks (around 09/12/2020) for Return OB 2hr GTT.   Raelyn Mora, CNM [facility] [facility], [facility]

## 2020-08-22 NOTE — Patient Instructions (Signed)
Glucose Tolerance Test During Pregnancy Why am I having this test? The glucose tolerance test (GTT) is done to check how your body processes sugar (glucose). This is one of several tests used to diagnose diabetes that develops during pregnancy (gestational diabetes mellitus). Gestational diabetes is a temporary form of diabetes that some women develop during pregnancy. It usually occurs during the second trimester of pregnancy and goes away after delivery. Testing (screening) for gestational diabetes usually occurs between 24 and 28 weeks of pregnancy. You may have the GTT test after having a 1-hour glucose screening test if the results from that test indicate that you may have gestational diabetes. You may also have this test if:  You have a history of gestational diabetes.  You have a history of giving birth to very large babies or have experienced repeated fetal loss (stillbirth).  You have signs and symptoms of diabetes, such as: ? Changes in your vision. ? Tingling or numbness in your hands or feet. ? Changes in hunger, thirst, and urination that are not otherwise explained by your pregnancy. What is being tested? This test measures the amount of glucose in your blood at different times during a period of 3 hours. This indicates how well your body is able to process glucose. What kind of sample is taken?  Blood samples are required for this test. They are usually collected by inserting a needle into a blood vessel. How do I prepare for this test?  For 3 days before your test, eat normally. Have plenty of carbohydrate-rich foods.  Follow instructions from your health care provider about: ? Eating or drinking restrictions on the day of the test. You may be asked to not eat or drink anything other than water (fast) starting 8-10 hours before the test. ? Changing or stopping your regular medicines. Some medicines may interfere with this test. Tell a health care provider about:  All  medicines you are taking, including vitamins, herbs, eye drops, creams, and over-the-counter medicines.  Any blood disorders you have.  Any surgeries you have had.  Any medical conditions you have. What happens during the test? First, your blood glucose will be measured. This is referred to as your fasting blood glucose, since you fasted before the test. Then, you will drink a glucose solution that contains a certain amount of glucose. Your blood glucose will be measured again 1, 2, and 3 hours after drinking the solution. This test takes about 3 hours to complete. You will need to stay at the testing location during this time. During the testing period:  Do not eat or drink anything other than the glucose solution.  Do not exercise.  Do not use any products that contain nicotine or tobacco, such as cigarettes and e-cigarettes. If you need help stopping, ask your health care provider. The testing procedure may vary among health care providers and hospitals. How are the results reported? Your results will be reported as milligrams of glucose per deciliter of blood (mg/dL) or millimoles per liter (mmol/L). Your health care provider will compare your results to normal ranges that were established after testing a large group of people (reference ranges). Reference ranges may vary among labs and hospitals. For this test, common reference ranges are:  Fasting: less than 95-105 mg/dL (5.3-5.8 mmol/L).  1 hour after drinking glucose: less than 180-190 mg/dL (10.0-10.5 mmol/L).  2 hours after drinking glucose: less than 155-165 mg/dL (8.6-9.2 mmol/L).  3 hours after drinking glucose: 140-145 mg/dL (7.8-8.1 mmol/L). What do the   results mean? Results within reference ranges are considered normal, meaning that your glucose levels are well-controlled. If two or more of your blood glucose levels are high, you may be diagnosed with gestational diabetes. If only one level is high, your health care  provider may suggest repeat testing or other tests to confirm a diagnosis. Talk with your health care provider about what your results mean. Questions to ask your health care provider Ask your health care provider, or the department that is doing the test:  When will my results be ready?  How will I get my results?  What are my treatment options?  What other tests do I need?  What are my next steps? Summary  The glucose tolerance test (GTT) is one of several tests used to diagnose diabetes that develops during pregnancy (gestational diabetes mellitus). Gestational diabetes is a temporary form of diabetes that some women develop during pregnancy.  You may have the GTT test after having a 1-hour glucose screening test if the results from that test indicate that you may have gestational diabetes. You may also have this test if you have any symptoms or risk factors for gestational diabetes.  Talk with your health care provider about what your results mean. This information is not intended to replace advice given to you by your health care provider. Make sure you discuss any questions you have with your health care provider. Document Revised: 12/22/2018 Document Reviewed: 04/12/2017 Elsevier Patient Education  2020 Elsevier Inc.  

## 2020-09-14 NOTE — L&D Delivery Note (Addendum)
OB/GYN Faculty Practice Delivery Note  Leah Martin is a 25 y.o. G2P0010 s/p vaginal delivery at [redacted]w[redacted]d. She was admitted for SROM.   ROM: at delivery clear fluid GBS Status: negative Maximum Maternal Temperature: 100.68F  Labor Progress: Labor progressed with augmentation of pitocin to complete cervical dilation. Given up-trending maternal temperature, pt received ampicillin and gentamycin prior to delivery. Delivery complicated by brief shoulder dystocia which resolved with suprapubic pressure and McRoberts and noted below.  Delivery Date/Time: 0940 on 12/01/2020 Delivery: Called to room and patient was complete and pushing. Head delivered ROP. No nuchal cord present x1, reduced prior to delivery. Shoulder dystocia x15 sec, resolved s/p McRoberts and suprapubic pressure. Infant with spontaneous cry, placed on mother's abdomen, dried and stimulated. Cord clamped x 2 after 1-minute delay, and cut by FOB under my direct supervision. Cord blood drawn. Placenta delivered spontaneously with gentle cord traction. Fundus firm with massage and Pitocin. Labia, perineum, vagina, and cervix were inspected, notable for 1st degree perineal laceration s/p repair with 3-0 vicryl.   Placenta: 3-vessel, intact, sent to L&D Complications: 15 sec shoulder dystocia, resolved s/p McRoberts & suprapubic pressure Lacerations: first degree perineal laceration EBL: 50 mL Analgesia: epidural  Infant: viable female  APGARs TBD  weight per medical record  Leah Hoover MD, PGY1   I was present and gloved for delivery of infant and placenta as noted above. I assisted with shoulder dystocia and vaginal laceration repair.  Sheila Oats, MD OB Fellow, Faculty Practice 12/01/2020 1:07 PM

## 2020-09-19 ENCOUNTER — Ambulatory Visit (INDEPENDENT_AMBULATORY_CARE_PROVIDER_SITE_OTHER): Payer: Medicaid Other | Admitting: Obstetrics and Gynecology

## 2020-09-19 ENCOUNTER — Other Ambulatory Visit: Payer: Self-pay

## 2020-09-19 ENCOUNTER — Encounter: Payer: Self-pay | Admitting: Obstetrics and Gynecology

## 2020-09-19 VITALS — BP 105/68 | HR 93 | Temp 98.3°F | Wt 140.6 lb

## 2020-09-19 DIAGNOSIS — Z3A29 29 weeks gestation of pregnancy: Secondary | ICD-10-CM

## 2020-09-19 DIAGNOSIS — Z3480 Encounter for supervision of other normal pregnancy, unspecified trimester: Secondary | ICD-10-CM

## 2020-09-19 NOTE — Progress Notes (Signed)
   LOW-RISK PREGNANCY OFFICE VISIT Patient name: Leah Martin MRN [id number]  Date of birth: April 07, 1996 Chief Complaint:   Routine Prenatal Visit  History of Present Illness:   Leah Martin is a 25 y.o. G2P0010 female at [redacted]w[redacted]d with an Estimated Date of Delivery: 12/02/20 being seen today for ongoing management of a low-risk pregnancy.  Today she reports no complaints. Contractions: Not present. Vag. Bleeding: None.  Movement: Present. denies leaking of fluid. Review of Systems:   Pertinent items are noted in HPI Denies abnormal vaginal discharge w/ itching/odor/irritation, headaches, visual changes, shortness of breath, chest pain, abdominal pain, severe nausea/vomiting, or problems with urination or bowel movements unless otherwise stated above. Pertinent History Reviewed:  Reviewed past medical,surgical, social, obstetrical and family history.  Reviewed problem list, medications and allergies. Physical Assessment:   Vitals:   09/19/20 0831  BP: 105/68  Pulse: 93  Temp: 98.3 F (36.8 C)  Weight: 140 lb 9.6 oz (63.8 kg)  Body mass index is 25.72 kg/m.        Physical Examination:   General appearance: Well appearing, and in no distress  Mental status: Alert, oriented to person, place, and time  Skin: Warm & dry  Cardiovascular: Normal heart rate noted  Respiratory: Normal respiratory effort, no distress  Abdomen: Soft, gravid, nontender  Pelvic: Cervical exam deferred         Extremities: Edema: None  Fetal Status: Fetal Heart Rate (bpm): 156 Fundal Height: 28 cm Movement: Present    No results found for this or any previous visit (from the past 24 hour(s)).  Assessment & Plan:  1) Low-risk pregnancy G2P0010 at [redacted]w[redacted]d with an Estimated Date of Delivery: 12/02/20   2) Supervision of other normal pregnancy, antepartum  - Glucose Tolerance, 2 Hours w/1 Hour,  unable to keep glucola drink down; will need to reschedule - HIV Antibody (routine testing w rflx),  - RPR,   - CBC  3) [redacted] weeks gestation of pregnancy    Meds: No orders of the defined types were placed in this encounter.  Labs/procedures today: 2 hr GTT and 3rd trimester labs  Plan:  Continue routine obstetrical care   Reviewed: Preterm labor symptoms and general obstetric precautions including but not limited to vaginal bleeding, contractions, leaking of fluid and fetal movement were reviewed in detail with the patient.  All questions were answered. Has home bp cuff.  Check bp weekly, let us know if >140/90.   Follow-up: Return in about 4 weeks (around 10/17/2020) for Return OB - My Chart video.  Orders Placed This Encounter  Procedures  . Glucose Tolerance, 2 Hours w/1 Hour  . HIV Antibody (routine testing w rflx)  . RPR  . CBC   Raelyn Mora MSN, CNM 09/19/2020

## 2020-09-20 LAB — CBC
Hematocrit: 35.8 % (ref 34.0–46.6)
Hemoglobin: 12 g/dL (ref 11.1–15.9)
MCH: 31.4 pg (ref 26.6–33.0)
MCHC: 33.5 g/dL (ref 31.5–35.7)
MCV: 94 fL (ref 79–97)
Platelets: 239 10*3/uL (ref 150–450)
RBC: 3.82 x10E6/uL (ref 3.77–5.28)
RDW: 13 % (ref 11.7–15.4)
WBC: 10.3 10*3/uL (ref 3.4–10.8)

## 2020-09-20 LAB — HIV ANTIBODY (ROUTINE TESTING W REFLEX): HIV Screen 4th Generation wRfx: NONREACTIVE

## 2020-09-20 LAB — RPR: RPR Ser Ql: NONREACTIVE

## 2020-09-24 ENCOUNTER — Other Ambulatory Visit (INDEPENDENT_AMBULATORY_CARE_PROVIDER_SITE_OTHER): Payer: Medicaid Other | Admitting: *Deleted

## 2020-09-24 ENCOUNTER — Other Ambulatory Visit: Payer: Self-pay

## 2020-09-24 DIAGNOSIS — Z3483 Encounter for supervision of other normal pregnancy, third trimester: Secondary | ICD-10-CM

## 2020-09-24 DIAGNOSIS — Z3A33 33 weeks gestation of pregnancy: Secondary | ICD-10-CM

## 2020-09-24 DIAGNOSIS — Z3480 Encounter for supervision of other normal pregnancy, unspecified trimester: Secondary | ICD-10-CM

## 2020-09-24 NOTE — Progress Notes (Signed)
   Patient in clinic to repeat 2 hr gtt with Jelly beans.   Clovis Pu, RN

## 2020-09-25 LAB — GLUCOSE TOLERANCE, 2 HOURS W/ 1HR
Glucose, 1 hour: 92 mg/dL (ref 65–179)
Glucose, 2 hour: 131 mg/dL (ref 65–152)
Glucose, Fasting: 74 mg/dL (ref 65–91)

## 2020-10-01 ENCOUNTER — Inpatient Hospital Stay (HOSPITAL_COMMUNITY)
Admission: AD | Admit: 2020-10-01 | Discharge: 2020-10-01 | Disposition: A | Discharge status: Home / Self Care | Payer: Medicaid Other | Attending: Obstetrics and Gynecology | Admitting: Obstetrics and Gynecology

## 2020-10-01 ENCOUNTER — Other Ambulatory Visit: Payer: Self-pay | Admitting: Family Medicine

## 2020-10-01 ENCOUNTER — Encounter (HOSPITAL_COMMUNITY): Payer: Self-pay | Admitting: Obstetrics and Gynecology

## 2020-10-01 ENCOUNTER — Other Ambulatory Visit: Payer: Self-pay

## 2020-10-01 ENCOUNTER — Telehealth: Payer: Self-pay | Admitting: Family Medicine

## 2020-10-01 DIAGNOSIS — O98513 Other viral diseases complicating pregnancy, third trimester: Secondary | ICD-10-CM

## 2020-10-01 DIAGNOSIS — Z3A31 31 weeks gestation of pregnancy: Secondary | ICD-10-CM

## 2020-10-01 DIAGNOSIS — R059 Cough, unspecified: Secondary | ICD-10-CM | POA: Diagnosis present

## 2020-10-01 DIAGNOSIS — Z3480 Encounter for supervision of other normal pregnancy, unspecified trimester: Secondary | ICD-10-CM

## 2020-10-01 DIAGNOSIS — O368130 Decreased fetal movements, third trimester, not applicable or unspecified: Secondary | ICD-10-CM | POA: Diagnosis not present

## 2020-10-01 DIAGNOSIS — O99891 Other specified diseases and conditions complicating pregnancy: Secondary | ICD-10-CM

## 2020-10-01 DIAGNOSIS — O26893 Other specified pregnancy related conditions, third trimester: Secondary | ICD-10-CM | POA: Diagnosis not present

## 2020-10-01 DIAGNOSIS — R7989 Other specified abnormal findings of blood chemistry: Secondary | ICD-10-CM | POA: Insufficient documentation

## 2020-10-01 DIAGNOSIS — R945 Abnormal results of liver function studies: Secondary | ICD-10-CM

## 2020-10-01 DIAGNOSIS — U071 COVID-19: Secondary | ICD-10-CM | POA: Diagnosis not present

## 2020-10-01 DIAGNOSIS — R000 Tachycardia, unspecified: Secondary | ICD-10-CM

## 2020-10-01 DIAGNOSIS — B349 Viral infection, unspecified: Secondary | ICD-10-CM

## 2020-10-01 LAB — URINALYSIS, ROUTINE W REFLEX MICROSCOPIC
Bilirubin Urine: NEGATIVE
Glucose, UA: NEGATIVE mg/dL
Hgb urine dipstick: NEGATIVE
Ketones, ur: NEGATIVE mg/dL
Nitrite: NEGATIVE
Protein, ur: NEGATIVE mg/dL
Specific Gravity, Urine: 1.005 (ref 1.005–1.030)
pH: 6 (ref 5.0–8.0)

## 2020-10-01 LAB — COMPREHENSIVE METABOLIC PANEL
ALT: 48 U/L — ABNORMAL HIGH (ref 0–44)
AST: 49 U/L — ABNORMAL HIGH (ref 15–41)
Albumin: 2.6 g/dL — ABNORMAL LOW (ref 3.5–5.0)
Alkaline Phosphatase: 61 U/L (ref 38–126)
Anion gap: 9 (ref 5–15)
BUN: 6 mg/dL (ref 6–20)
CO2: 20 mmol/L — ABNORMAL LOW (ref 22–32)
Calcium: 8.1 mg/dL — ABNORMAL LOW (ref 8.9–10.3)
Chloride: 104 mmol/L (ref 98–111)
Creatinine, Ser: 0.49 mg/dL (ref 0.44–1.00)
GFR, Estimated: >60 mL/min (ref >60)
Glucose, Bld: 88 mg/dL (ref 70–99)
Potassium: 3.5 mmol/L (ref 3.5–5.1)
Sodium: 133 mmol/L — ABNORMAL LOW (ref 135–145)
Total Bilirubin: 0.4 mg/dL (ref 0.3–1.2)
Total Protein: 5.7 g/dL — ABNORMAL LOW (ref 6.5–8.1)

## 2020-10-01 LAB — CBC
HCT: 30.3 % — ABNORMAL LOW (ref 36.0–46.0)
Hemoglobin: 10.4 g/dL — ABNORMAL LOW (ref 12.0–15.0)
MCH: 32.5 pg (ref 26.0–34.0)
MCHC: 34.3 g/dL (ref 30.0–36.0)
MCV: 94.7 fL (ref 80.0–100.0)
Platelets: 185 10*3/uL (ref 150–400)
RBC: 3.2 MIL/uL — ABNORMAL LOW (ref 3.87–5.11)
RDW: 13.3 % (ref 11.5–15.5)
WBC: 6.2 10*3/uL (ref 4.0–10.5)
nRBC: 0 % (ref 0.0–0.2)

## 2020-10-01 LAB — SARS CORONAVIRUS 2 (TAT 6-24 HRS): SARS Coronavirus 2: POSITIVE — AB

## 2020-10-01 MED ORDER — LACTATED RINGERS IV BOLUS
1000.0000 mL | Freq: Once | INTRAVENOUS | Status: AC
  Administered 2020-10-01: 1000 mL via INTRAVENOUS

## 2020-10-01 MED ORDER — ONDANSETRON 4 MG PO TBDP: 4.0000 mg | ORAL_TABLET | Freq: Three times a day (TID) | ORAL | 0 refills | Status: DC | PRN

## 2020-10-01 MED ORDER — ONDANSETRON HCL 4 MG/2ML IJ SOLN
4.0000 mg | Freq: Once | INTRAMUSCULAR | Status: AC
  Administered 2020-10-01: 4 mg via INTRAVENOUS
  Filled 2020-10-01: qty 2

## 2020-10-01 NOTE — Discharge Instructions (Signed)
Fetal Movement Counts Patient Name: ________________________________________________ Patient Due Date: ____________________  What is a fetal movement count? A fetal movement count is the number of times that you feel your baby move during a certain amount of time. This may also be called a fetal kick count. A fetal movement count is recommended for every pregnant woman. You may be asked to start counting fetal movements as early as week 28 of your pregnancy. Pay attention to when your baby is most active. You may notice your baby's sleep and wake cycles. You may also notice things that make your baby move more. You should do a fetal movement count:  When your baby is normally most active.  At the same time each day. A good time to count movements is while you are resting, after having something to eat and drink. How do I count fetal movements? 1. Find a quiet, comfortable area. Sit, or lie down on your side. 2. Write down the date, the start time and stop time, and the number of movements that you felt between those two times. Take this information with you to your health care visits. 3. Write down your start time when you feel the first movement. 4. Count kicks, flutters, swishes, rolls, and jabs. You should feel at least 10 movements. 5. You may stop counting after you have felt 10 movements, or if you have been counting for 2 hours. Write down the stop time. 6. If you do not feel 10 movements in 2 hours, contact your health care provider for further instructions. Your health care provider may want to do additional tests to assess your baby's well-being. Contact a health care provider if:  You feel fewer than 10 movements in 2 hours.  Your baby is not moving like he or she usually does. Date: ____________ Start time: ____________ Stop time: ____________ Movements: ____________ Date: ____________ Start time: ____________ Stop time: ____________ Movements: ____________ Date: ____________  Start time: ____________ Stop time: ____________ Movements: ____________ Date: ____________ Start time: ____________ Stop time: ____________ Movements: ____________ Date: ____________ Start time: ____________ Stop time: ____________ Movements: ____________ Date: ____________ Start time: ____________ Stop time: ____________ Movements: ____________ Date: ____________ Start time: ____________ Stop time: ____________ Movements: ____________ Date: ____________ Start time: ____________ Stop time: ____________ Movements: ____________ Date: ____________ Start time: ____________ Stop time: ____________ Movements: ____________ This information is not intended to replace advice given to you by your health care provider. Make sure you discuss any questions you have with your health care provider. Document Revised: 04/20/2019 Document Reviewed: 04/20/2019 Elsevier Patient Education  2021 Elsevier Inc.  

## 2020-10-01 NOTE — Telephone Encounter (Signed)
Patient called and notified of +COVID result.   Return precautions reviewed.   Order placed for MAB clinic.

## 2020-10-01 NOTE — Progress Notes (Signed)
Referral for COVID treatments

## 2020-10-01 NOTE — MAU Note (Signed)
.   Leah Martin is a 25 y.o. at [redacted]w[redacted]d here in MAU reporting: fever, body aches, and chills. No VB or LOF. Reports decreased fetal movement since yesterday.   Vitals:   10/01/20 1445  BP: 122/84  Pulse: (!) 125  Resp: 20  Temp: 99.4 F (37.4 C)  SpO2: 98%   Lab orders placed from triage: UA

## 2020-10-01 NOTE — MAU Provider Note (Signed)
History     [id number]  Arrival date and time: 10/01/20 1434    Chief Complaint  Patient presents with  . Fever  . Generalized Body Aches     HPI Leah Martin is a 25 y.o. at [redacted]w[redacted]d who presents for cough and sore throat.   Patient reports that starting yesterday evening she began to have sore throat and cough.  Today developed fever.  Denies runny nose, nausea, or any appointment Patient denies loss of fluid or bleeding per vagina. No contractions. Reports decreased fetal movement since she started feeling ill.    B/Positive/-- (09/01 1553)  OB History    Gravida  2   Para      Term      Preterm      AB  1   Living        SAB  1   IAB      Ectopic      Multiple      Live Births              Past Medical History:  Diagnosis Date  . Medical history non-contributory     Past Surgical History:  Procedure Laterality Date  . DILATION AND CURETTAGE OF UTERUS N/A 08/09/2019   Procedure: SUCTION DILATATION AND CURETTAGE;  Surgeon: Conan Bowens, MD;  Location: [facility] OR;  Service: Gynecology;  Laterality: N/A;  . NO PAST SURGERIES      Family History  Problem Relation Age of Onset  . Kidney disease Mother     Social History   Socioeconomic History  . Marital status: Single    Spouse name: Not on file  . Number of children: Not on file  . Years of education: Not on file  . Highest education level: High school graduate  Occupational History  . Occupation: unemployed  Tobacco Use  . Smoking status: Never Smoker  . Smokeless tobacco: Never Used  Vaping Use  . Vaping Use: Never used  Substance and Sexual Activity  . Alcohol use: No  . Drug use: No  . Sexual activity: Yes    Birth control/protection: None  Other Topics Concern  . Not on file  Social History Narrative  . Not on file   Social Determinants of Health   Financial Resource Strain: Not on file  Food Insecurity: Not on file  Transportation Needs: Not on file  Physical  Activity: Not on file  Stress: Not on file  Social Connections: Not on file  Intimate Partner Violence: Not on file    No Known Allergies  No current facility-administered medications on file prior to encounter.   Current Outpatient Medications on File Prior to Encounter  Medication Sig Dispense Refill  . Prenatal Vit-Fe Fumarate-FA (PRENATAL PLUS/IRON) 27-1 MG TABS Take 1 tablet by mouth daily. 30 tablet 12  . terconazole (TERAZOL 7) 0.4 % vaginal cream Place 1 applicator vaginally at bedtime. Bedtime for 3 days. (Patient not taking: Reported on 09/19/2020) 45 g 0     ROS Pertinent positives and negative per HPI, all others reviewed and negative  Physical Exam   BP (!) 107/55 (BP Location: Right Arm)   Pulse (!) 115   Temp 99 F (37.2 C)   Resp 15   LMP 02/26/2020 (Exact Date)   SpO2 99%   Patient Vitals for the past 24 hrs:  BP Temp Temp src Pulse Resp SpO2  10/01/20 1719 (!) 107/55 99 F (37.2 C) -- (!) 115 15 99 %  10/01/20 1445 122/84 99.4 F (37.4 C) Oral (!) 125 20 98 %    Physical Exam Vitals reviewed.  Constitutional:      General: She is not in acute distress.    Appearance: She is well-developed and well-nourished. She is not diaphoretic.  Eyes:     General: No scleral icterus. Pulmonary:     Effort: Pulmonary effort is normal. No respiratory distress.  Abdominal:     General: There is no distension.     Palpations: Abdomen is soft.     Tenderness: There is no abdominal tenderness. There is no guarding or rebound.  Musculoskeletal:        General: No edema.  Skin:    General: Skin is warm and dry.  Neurological:     Mental Status: She is alert.     Coordination: Coordination normal.  Psychiatric:        Mood and Affect: Mood and affect normal.      Cervical Exam    Bedside Ultrasound Not done  My interpretation: n/a  FHT Baseline 150, moderate variability, +accels, no decels Toco: irregular Cat: I  Labs Results for orders placed or  performed during the hospital encounter of 10/01/20 (from the past 24 hour(s))  Comprehensive metabolic panel     Status: Abnormal   Collection Time: 10/01/20  3:21 PM  Result Value Ref Range   Sodium 133 (L) 135 - 145 mmol/L   Potassium 3.5 3.5 - 5.1 mmol/L   Chloride 104 98 - 111 mmol/L   CO2 20 (L) 22 - 32 mmol/L   Glucose, Bld 88 70 - 99 mg/dL   BUN 6 6 - 20 mg/dL   Creatinine, Ser 2.63 0.44 - 1.00 mg/dL   Calcium 8.1 (L) 8.9 - 10.3 mg/dL   Total Protein 5.7 (L) 6.5 - 8.1 g/dL   Albumin 2.6 (L) 3.5 - 5.0 g/dL   AST 49 (H) 15 - 41 U/L   ALT 48 (H) 0 - 44 U/L   Alkaline Phosphatase 61 38 - 126 U/L   Total Bilirubin 0.4 0.3 - 1.2 mg/dL   GFR, Estimated >33 >54 mL/min   Anion gap 9 5 - 15  CBC     Status: Abnormal   Collection Time: 10/01/20  3:21 PM  Result Value Ref Range   WBC 6.2 4.0 - 10.5 K/uL   RBC 3.20 (L) 3.87 - 5.11 MIL/uL   Hemoglobin 10.4 (L) 12.0 - 15.0 g/dL   HCT 56.2 (L) 56.3 - 89.3 %   MCV 94.7 80.0 - 100.0 fL   MCH 32.5 26.0 - 34.0 pg   MCHC 34.3 30.0 - 36.0 g/dL   RDW 73.4 28.7 - 68.1 %   Platelets 185 150 - 400 K/uL   nRBC 0.0 0.0 - 0.2 %  Urinalysis, Routine w reflex microscopic Urine, Clean Catch     Status: Abnormal   Collection Time: 10/01/20  4:12 PM  Result Value Ref Range   Color, Urine YELLOW YELLOW   APPearance CLEAR CLEAR   Specific Gravity, Urine 1.005 1.005 - 1.030   pH 6.0 5.0 - 8.0   Glucose, UA NEGATIVE NEGATIVE mg/dL   Hgb urine dipstick NEGATIVE NEGATIVE   Bilirubin Urine NEGATIVE NEGATIVE   Ketones, ur NEGATIVE NEGATIVE mg/dL   Protein, ur NEGATIVE NEGATIVE mg/dL   Nitrite NEGATIVE NEGATIVE   Leukocytes,Ua MODERATE (A) NEGATIVE   RBC / HPF 0-5 0 - 5 RBC/hpf   WBC, UA 0-5 0 - 5 WBC/hpf  Bacteria, UA RARE (A) NONE SEEN   Squamous Epithelial / LPF 0-5 0 - 5    Imaging No results found.  MAU Course  Procedures  Lab Orders     SARS CORONAVIRUS 2 (TAT 6-24 HRS) Nasopharyngeal Nasopharyngeal Swab     Comprehensive metabolic  panel     CBC     Urinalysis, Routine w reflex microscopic Urine, Clean Catch Meds ordered this encounter  Medications  . lactated ringers bolus 1,000 mL  . ondansetron (ZOFRAN) injection 4 mg  . ondansetron (ZOFRAN ODT) 4 MG disintegrating tablet    Sig: Take 1 tablet (4 mg total) by mouth every 8 (eight) hours as needed for nausea or vomiting.    Dispense:  20 tablet    Refill:  0   Imaging Orders  No imaging studies ordered today    MDM moderate  Assessment and Plan  #Viral illness #Tachycardia Mild tachycardia on presentation, much improved with IVF. Appears to have viral illness, likely COVID, swab obtained and will call with results. Stressed maintaining hydration, tylenol for symptoms.   #DFM Decreased fetal movement resolved while in MAU and reactive NST.   #Elevated LFTs Suspect secondary to dehydration and likely COVID infection, will message outpatient provider to recheck at next visit. HBV and HCV serologies negative on initial OB labs.   #FWB FHT Cat I NST: Reactive  Venora Maples, MD/MPH 10/01/20 2:52 PM

## 2020-10-02 ENCOUNTER — Encounter: Payer: Self-pay | Admitting: Unknown Physician Specialty

## 2020-10-02 ENCOUNTER — Telehealth: Payer: Self-pay | Admitting: Unknown Physician Specialty

## 2020-10-02 ENCOUNTER — Other Ambulatory Visit: Payer: Self-pay | Admitting: Unknown Physician Specialty

## 2020-10-02 DIAGNOSIS — U071 COVID-19: Secondary | ICD-10-CM

## 2020-10-02 DIAGNOSIS — O98513 Other viral diseases complicating pregnancy, third trimester: Secondary | ICD-10-CM

## 2020-10-02 NOTE — Telephone Encounter (Signed)
I connected by phone with Leah Martin on 10/02/2020 at 4:39 PM to discuss the potential use of a new treatment for mild to moderate COVID-19 viral infection in non-hospitalized patients.  This patient is a 25 y.o. female that meets the FDA criteria for Emergency Use Authorization of COVID monoclonal antibody sotrovimab.  Has a (+) direct SARS-CoV-2 viral test result  Has mild or moderate COVID-19   Is NOT hospitalized due to COVID-19  Is within 10 days of symptom onset  Has at least one of the high risk factor(s) for progression to severe COVID-19 and/or hospitalization as defined in EUA.  Specific high risk criteria : Pregnancy   I have spoken and communicated the following to the patient or parent/caregiver regarding COVID monoclonal antibody treatment:  1. FDA has authorized the emergency use for the treatment of mild to moderate COVID-19 in adults and pediatric patients with positive results of direct SARS-CoV-2 viral testing who are 25 years of age and older weighing at least 40 kg, and who are at high risk for progressing to severe COVID-19 and/or hospitalization.  2. The significant known and potential risks and benefits of COVID monoclonal antibody, and the extent to which such potential risks and benefits are unknown.  3. Information on available alternative treatments and the risks and benefits of those alternatives, including clinical trials.  4. Patients treated with COVID monoclonal antibody should continue to self-isolate and use infection control measures (e.g., wear mask, isolate, social distance, avoid sharing personal items, clean and disinfect "high touch" surfaces, and frequent handwashing) according to CDC guidelines.   5. The patient or parent/caregiver has the option to accept or refuse COVID monoclonal antibody treatment.  After reviewing this information with the patient, the patient has agreed to receive one of the available covid 19 monoclonal antibodies  and will be provided an appropriate fact sheet prior to infusion. [name], NP 10/02/2020 4:39 PM  Sx onset 1/15

## 2020-10-03 ENCOUNTER — Ambulatory Visit (HOSPITAL_COMMUNITY): Admission: RE | Admit: 2020-10-03 | Payer: Medicaid Other | Source: Ambulatory Visit

## 2020-10-17 ENCOUNTER — Encounter: Payer: Self-pay | Admitting: Obstetrics and Gynecology

## 2020-10-17 ENCOUNTER — Telehealth (INDEPENDENT_AMBULATORY_CARE_PROVIDER_SITE_OTHER): Payer: Medicaid Other | Admitting: Obstetrics and Gynecology

## 2020-10-17 VITALS — BP 127/85 | HR 88 | Wt 146.0 lb

## 2020-10-17 DIAGNOSIS — Z8616 Personal history of COVID-19: Secondary | ICD-10-CM

## 2020-10-17 DIAGNOSIS — Z3480 Encounter for supervision of other normal pregnancy, unspecified trimester: Secondary | ICD-10-CM

## 2020-10-17 DIAGNOSIS — Z3A33 33 weeks gestation of pregnancy: Secondary | ICD-10-CM

## 2020-10-17 DIAGNOSIS — Z3483 Encounter for supervision of other normal pregnancy, third trimester: Secondary | ICD-10-CM

## 2020-10-17 NOTE — Progress Notes (Signed)
   MY CHART VIDEO VIRTUAL OBSTETRICS VISIT ENCOUNTER NOTE  I connected with Sherilyn Cooter on 10/17/20 at 11:10 AM EST by My Chart video at home and verified that I am speaking with the correct person using two identifiers. Provider located at [facility] for [employer] at [place].   I discussed the limitations, risks, security and privacy concerns of performing an evaluation and management service by My Chart video and the availability of in person appointments. I also discussed with the patient that there may be a patient responsible charge related to this service. The patient expressed understanding and agreed to proceed.  Subjective:  PHILIS DOKE is a 25 y.o. G2P0010 at [redacted]w[redacted]d being followed for ongoing prenatal care.  She is currently monitored for the following issues for this low-risk pregnancy and has Fever of unknown origin; Supervision of other normal pregnancy, antepartum; LGSIL on Pap smear of cervix; and COVID-19 affecting pregnancy in third trimester on their problem list.  Patient reports no complaints. Reports fetal movement. Denies any contractions, bleeding or leaking of fluid.   The following portions of the patient's history were reviewed and updated as appropriate: allergies, current medications, past family history, past medical history, past social history, past surgical history and problem list.   Objective:   General:  Alert, oriented and cooperative.   Mental Status: Normal mood and affect perceived. Normal judgment and thought content.  Rest of physical exam deferred due to type of encounter  BP 127/85   Pulse 88   Wt 146 lb (66.2 kg)   LMP 02/26/2020 (Exact Date)   BMI 26.70 kg/m  **Done by patient's own at home BP cuff and scale  Assessment and Plan:  Pregnancy: G2P0010 at [redacted]w[redacted]d  1. Supervision of other normal pregnancy, antepartum - Anticipatory guidance for GBS screening at 36 wks. Explained the test is important to be done at this time in  pregnancy to ensure adequate treatment at the time of delivery. Explained that a positive result does not mean any harm to her, but can be harmful to the baby. Meaning that if baby is exposed to the bacteria for too long without antibiotics, the bay has the potential to develop pneumonia, septicemia, or spinal meningitis and could end up in the NICU. Also, explained that a cervical exam may be performed at the time of testing to get a baseline cervical check and make sure there is no preterm cervical dilation.  2. [redacted] weeks gestation of pregnancy    Preterm labor symptoms and general obstetric precautions including but not limited to vaginal bleeding, contractions, leaking of fluid and fetal movement were reviewed in detail with the patient.  I discussed the assessment and treatment plan with the patient. The patient was provided an opportunity to ask questions and all were answered. The patient agreed with the plan and demonstrated an understanding of the instructions. The patient was advised to call back or seek an in-person office evaluation/go to MAU at [facility] for any urgent or concerning symptoms. Please refer to After Visit Summary for other counseling recommendations.   I provided 5 minutes of non-face-to-face time during this encounter. There was 5 minutes of chart review time spent prior to this encounter. Total time spent = 10 minutes.  Return in about 3 weeks (around 11/07/2020) for Return OB w/GBS.  No future appointments.  Raelyn Mora, CNM [facility] for [employer], [facility]

## 2020-10-17 NOTE — Patient Instructions (Signed)

## 2020-11-07 ENCOUNTER — Encounter: Payer: Self-pay | Admitting: Obstetrics and Gynecology

## 2020-11-07 ENCOUNTER — Ambulatory Visit (INDEPENDENT_AMBULATORY_CARE_PROVIDER_SITE_OTHER): Payer: Medicaid Other | Admitting: Obstetrics and Gynecology

## 2020-11-07 ENCOUNTER — Other Ambulatory Visit: Payer: Self-pay

## 2020-11-07 ENCOUNTER — Other Ambulatory Visit (HOSPITAL_COMMUNITY)
Admission: RE | Admit: 2020-11-07 | Discharge: 2020-11-07 | Disposition: A | Discharge status: Home / Self Care | Payer: Medicaid Other | Source: Ambulatory Visit | Attending: Obstetrics and Gynecology | Admitting: Obstetrics and Gynecology

## 2020-11-07 VITALS — BP 115/74 | HR 93 | Wt 151.8 lb

## 2020-11-07 DIAGNOSIS — Z3480 Encounter for supervision of other normal pregnancy, unspecified trimester: Secondary | ICD-10-CM

## 2020-11-07 DIAGNOSIS — Z3A36 36 weeks gestation of pregnancy: Secondary | ICD-10-CM

## 2020-11-07 NOTE — Patient Instructions (Signed)

## 2020-11-07 NOTE — Progress Notes (Signed)
° °  LOW-RISK PREGNANCY OFFICE VISIT Patient name: Leah Martin MRN [id number]  Date of birth: 1996/04/09 Chief Complaint:   Routine Prenatal Visit  History of Present Illness:   Leah Martin is a 25 y.o. G55P0010 female at [redacted]w[redacted]d with an Estimated Date of Delivery: 12/02/20 being seen today for ongoing management of a low-risk pregnancy.  Today she reports no complaints. Contractions: Not present. Vag. Bleeding: None.  Movement: Present. denies leaking of fluid. Review of Systems:   Pertinent items are noted in HPI Denies abnormal vaginal discharge w/ itching/odor/irritation, headaches, visual changes, shortness of breath, chest pain, abdominal pain, severe nausea/vomiting, or problems with urination or bowel movements unless otherwise stated above. Pertinent History Reviewed:  Reviewed past medical,surgical, social, obstetrical and family history.  Reviewed problem list, medications and allergies. Physical Assessment:   Vitals:   11/07/20 1601  BP: 115/74  Pulse: 93  Weight: 151 lb 12.8 oz (68.9 kg)  Body mass index is 27.76 kg/m.        Physical Examination:   General appearance: Well appearing, and in no distress  Mental status: Alert, oriented to person, place, and time  Skin: Warm & dry  Cardiovascular: Normal heart rate noted  Respiratory: Normal respiratory effort, no distress  Abdomen: Soft, gravid, nontender  Pelvic: Cervical exam performed  Dilation: 1 Effacement (%): Thick Station: Ballotable  Extremities: Edema: None  Fetal Status: Fetal Heart Rate (bpm): 171 Fundal Height: 36 cm Movement: Present Presentation: Vertex   Assessment & Plan:  1) Low-risk pregnancy G2P0010 at [redacted]w[redacted]d with an Estimated Date of Delivery: 12/02/20   2) Supervision of other normal pregnancy, antepartum  - Culture, beta strep (group b only),  - GC/Chlamydia probe amp ([place])not at [facility]  3) [redacted] weeks gestation of pregnancy    Meds: No orders of the defined types were placed in  this encounter.  Labs/procedures today: GBS, GC/CT and cervical exam  Plan:  Continue routine obstetrical care   Reviewed: Preterm labor symptoms and general obstetric precautions including but not limited to vaginal bleeding, contractions, leaking of fluid and fetal movement were reviewed in detail with the patient.  All questions were answered. Has home bp cuff. Check bp weekly, let us know if >140/90.   Follow-up: Return in about 2 weeks (around 11/21/2020) for Return OB visit.  No orders of the defined types were placed in this encounter.  Raelyn Mora MSN, CNM 11/07/2020 4:18 PM

## 2020-11-11 LAB — GC/CHLAMYDIA PROBE AMP (~~LOC~~) NOT AT ARMC
Chlamydia: NEGATIVE
Comment: NEGATIVE
Comment: NORMAL
Neisseria Gonorrhea: NEGATIVE

## 2020-11-11 LAB — CULTURE, BETA STREP (GROUP B ONLY): Strep Gp B Culture: NEGATIVE

## 2020-11-20 ENCOUNTER — Encounter: Payer: Self-pay | Admitting: Obstetrics and Gynecology

## 2020-11-20 ENCOUNTER — Ambulatory Visit (INDEPENDENT_AMBULATORY_CARE_PROVIDER_SITE_OTHER): Payer: Medicaid Other | Admitting: Obstetrics and Gynecology

## 2020-11-20 ENCOUNTER — Other Ambulatory Visit: Payer: Self-pay

## 2020-11-20 VITALS — BP 106/73 | HR 98 | Temp 97.9°F | Wt 155.8 lb

## 2020-11-20 DIAGNOSIS — Z3480 Encounter for supervision of other normal pregnancy, unspecified trimester: Secondary | ICD-10-CM

## 2020-11-20 DIAGNOSIS — Z3A38 38 weeks gestation of pregnancy: Secondary | ICD-10-CM

## 2020-11-20 NOTE — Progress Notes (Signed)
   LOW-RISK PREGNANCY OFFICE VISIT Patient name: Leah Martin MRN [id number]  Date of birth: 02-02-1996 Chief Complaint:   Routine Prenatal Visit  History of Present Illness:   Leah Martin is a 25 y.o. G2P0010 female at [redacted]w[redacted]d with an Estimated Date of Delivery: 12/02/20 being seen today for ongoing management of a low-risk pregnancy.  Today she reports occasional contractions. Contractions: Not present. Vag. Bleeding: None.  Movement: Present. denies leaking of fluid. Review of Systems:   Pertinent items are noted in HPI Denies abnormal vaginal discharge w/ itching/odor/irritation, headaches, visual changes, shortness of breath, chest pain, abdominal pain, severe nausea/vomiting, or problems with urination or bowel movements unless otherwise stated above. Pertinent History Reviewed:  Reviewed past medical,surgical, social, obstetrical and family history.  Reviewed problem list, medications and allergies. Physical Assessment:   Vitals:   11/20/20 1356  BP: 106/73  Pulse: 98  Temp: 97.9 F (36.6 C)  Weight: 155 lb 12.8 oz (70.7 kg)  Body mass index is 28.5 kg/m.        Physical Examination:   General appearance: Well appearing, and in no distress  Mental status: Alert, oriented to person, place, and time  Skin: Warm & dry  Cardiovascular: Normal heart rate noted  Respiratory: Normal respiratory effort, no distress  Abdomen: Soft, gravid, nontender  Pelvic: Cervical exam performed  Dilation: 1 Effacement (%): Thick Station: Ballotable  Extremities: Edema: Mild pitting, slight indentation  Fetal Status: Fetal Heart Rate (bpm): 143 Fundal Height: 36 cm Movement: Present Presentation: Vertex  No results found for this or any previous visit (from the past 24 hour(s)).  Assessment & Plan:  1) Low-risk pregnancy G2P0010 at [redacted]w[redacted]d with an Estimated Date of Delivery: 12/02/20   2) Supervision of other normal pregnancy, antepartum - Reviewed labor precautions  3) [redacted] weeks  gestation of pregnancy    Meds: No orders of the defined types were placed in this encounter.  Labs/procedures today: cervical exam  Plan:  Continue routine obstetrical care   Reviewed: Term labor symptoms and general obstetric precautions including but not limited to vaginal bleeding, contractions, leaking of fluid and fetal movement were reviewed in detail with the patient.  All questions were answered. Has home bp cuff. Check bp weekly, let us know if >140/90.   Follow-up: Return in about 1 week (around 11/27/2020) for Return OB visit.  No orders of the defined types were placed in this encounter.  Raelyn Mora MSN, CNM 11/20/2020 2:42 PM

## 2020-11-28 ENCOUNTER — Encounter: Payer: Self-pay | Admitting: Obstetrics and Gynecology

## 2020-11-28 ENCOUNTER — Other Ambulatory Visit: Payer: Self-pay

## 2020-11-28 ENCOUNTER — Ambulatory Visit (INDEPENDENT_AMBULATORY_CARE_PROVIDER_SITE_OTHER): Payer: Medicaid Other | Admitting: Obstetrics and Gynecology

## 2020-11-28 VITALS — BP 118/79 | HR 102 | Temp 98.2°F | Wt 160.0 lb

## 2020-11-28 DIAGNOSIS — Z3A39 39 weeks gestation of pregnancy: Secondary | ICD-10-CM

## 2020-11-28 DIAGNOSIS — Z3480 Encounter for supervision of other normal pregnancy, unspecified trimester: Secondary | ICD-10-CM

## 2020-11-28 NOTE — Progress Notes (Addendum)
   LOW-RISK PREGNANCY OFFICE VISIT Patient name: Leah Martin MRN [id number]  Date of birth: Jun 10, 1996 Chief Complaint:   Routine Prenatal Visit  History of Present Illness:   Leah Martin is a 25 y.o. G2P0010 female at [redacted]w[redacted]d with an Estimated Date of Delivery: 12/02/20 being seen today for ongoing management of a low-risk pregnancy.  Today she reports occasional pelvic pressure. Contractions: Not present. Vag. Bleeding: None.  Movement: Present. denies leaking of fluid. Review of Systems:   Pertinent items are noted in HPI Denies abnormal vaginal discharge w/ itching/odor/irritation, headaches, visual changes, shortness of breath, chest pain, abdominal pain, severe nausea/vomiting, or problems with urination or bowel movements unless otherwise stated above. Pertinent History Reviewed:  Reviewed past medical,surgical, social, obstetrical and family history.  Reviewed problem list, medications and allergies. Physical Assessment:   Vitals:   11/28/20 1513  BP: 118/79  Pulse: (!) 102  Temp: 98.2 F (36.8 C)  Weight: 160 lb (72.6 kg)  Body mass index is 29.26 kg/m.        Physical Examination:   General appearance: Well appearing, and in no distress  Mental status: Alert, oriented to person, place, and time  Skin: Warm & dry  Cardiovascular: Normal heart rate noted  Respiratory: Normal respiratory effort, no distress  Abdomen: Soft, gravid, nontender  Pelvic: Cervical exam deferred         Extremities: Edema: None  Fetal Status: Fetal Heart Rate (bpm): 148 Fundal Height: 39 cm Movement: Present Presentation: Vertex  No results found for this or any previous visit (from the past 24 hour(s)).  Assessment & Plan:  1) Low-risk pregnancy G2P0010 at [redacted]w[redacted]d with an Estimated Date of Delivery: 12/02/20   2) Supervision of other normal pregnancy, antepartum - Information provided on signs of labor - Wants to schedule IOL at 41 weeks; to be done at next visit   3) [redacted] weeks  gestation of pregnancy    Meds: No orders of the defined types were placed in this encounter.  Labs/procedures today: none  Plan:  Continue routine obstetrical care   Reviewed: Term labor symptoms and general obstetric precautions including but not limited to vaginal bleeding, contractions, leaking of fluid and fetal movement were reviewed in detail with the patient.  All questions were answered. Has home bp cuff. Check bp weekly, let us know if >140/90.   Follow-up: Return in about 1 week (around 12/05/2020) for Return OB visit.  No orders of the defined types were placed in this encounter.  Raelyn Mora MSN, CNM 11/28/2020 3:23 PM

## 2020-11-28 NOTE — Patient Instructions (Signed)

## 2020-11-30 ENCOUNTER — Encounter (HOSPITAL_COMMUNITY): Payer: Self-pay | Admitting: Obstetrics & Gynecology

## 2020-11-30 ENCOUNTER — Inpatient Hospital Stay (HOSPITAL_COMMUNITY)
Admission: AD | Admit: 2020-11-30 | Discharge: 2020-12-03 | DRG: 807 | Disposition: A | Discharge status: Home / Self Care | Payer: Medicaid Other | Attending: Obstetrics & Gynecology | Admitting: Obstetrics & Gynecology

## 2020-11-30 ENCOUNTER — Other Ambulatory Visit: Payer: Self-pay

## 2020-11-30 ENCOUNTER — Inpatient Hospital Stay (HOSPITAL_COMMUNITY): Payer: Medicaid Other | Admitting: Anesthesiology

## 2020-11-30 DIAGNOSIS — Z3689 Encounter for other specified antenatal screening: Secondary | ICD-10-CM | POA: Diagnosis not present

## 2020-11-30 DIAGNOSIS — O98513 Other viral diseases complicating pregnancy, third trimester: Secondary | ICD-10-CM | POA: Diagnosis present

## 2020-11-30 DIAGNOSIS — Z3A39 39 weeks gestation of pregnancy: Secondary | ICD-10-CM

## 2020-11-30 DIAGNOSIS — Z8616 Personal history of COVID-19: Secondary | ICD-10-CM

## 2020-11-30 DIAGNOSIS — U071 COVID-19: Secondary | ICD-10-CM | POA: Diagnosis present

## 2020-11-30 DIAGNOSIS — O26893 Other specified pregnancy related conditions, third trimester: Secondary | ICD-10-CM | POA: Diagnosis present

## 2020-11-30 LAB — CBC
HCT: 34.3 % — ABNORMAL LOW (ref 36.0–46.0)
Hemoglobin: 12.2 g/dL (ref 12.0–15.0)
MCH: 32.9 pg (ref 26.0–34.0)
MCHC: 35.6 g/dL (ref 30.0–36.0)
MCV: 92.5 fL (ref 80.0–100.0)
Platelets: 298 10*3/uL (ref 150–400)
RBC: 3.71 MIL/uL — ABNORMAL LOW (ref 3.87–5.11)
RDW: 14 % (ref 11.5–15.5)
WBC: 9.5 10*3/uL (ref 4.0–10.5)
nRBC: 0 % (ref 0.0–0.2)

## 2020-11-30 LAB — TYPE AND SCREEN
ABO/RH(D): B POS
Antibody Screen: NEGATIVE

## 2020-11-30 LAB — POCT FERN TEST

## 2020-11-30 MED ORDER — EPHEDRINE 5 MG/ML INJ: 10.0000 mg | INTRAVENOUS | Status: DC | PRN

## 2020-11-30 MED ORDER — ONDANSETRON HCL 4 MG/2ML IJ SOLN
4.0000 mg | Freq: Four times a day (QID) | INTRAMUSCULAR | Status: DC | PRN
  Administered 2020-12-01: 4 mg via INTRAVENOUS
  Filled 2020-11-30: qty 2

## 2020-11-30 MED ORDER — OXYCODONE-ACETAMINOPHEN 5-325 MG PO TABS: 1.0000 | ORAL_TABLET | ORAL | Status: DC | PRN

## 2020-11-30 MED ORDER — TERBUTALINE SULFATE 1 MG/ML IJ SOLN
0.2500 mg | Freq: Once | INTRAMUSCULAR | Status: DC | PRN
Start: 2020-11-30 — End: 2020-12-01

## 2020-11-30 MED ORDER — FENTANYL-BUPIVACAINE-NACL 0.5-0.125-0.9 MG/250ML-% EP SOLN
12.0000 mL/h | EPIDURAL | Status: DC | PRN
  Administered 2020-11-30 – 2020-12-01 (×2): 12 mL/h via EPIDURAL
  Filled 2020-11-30: qty 250

## 2020-11-30 MED ORDER — LIDOCAINE HCL (PF) 1 % IJ SOLN
INTRAMUSCULAR | Status: DC | PRN
  Administered 2020-11-30: 11 mL via EPIDURAL

## 2020-11-30 MED ORDER — LIDOCAINE HCL (PF) 1 % IJ SOLN
30.0000 mL | INTRAMUSCULAR | Status: DC | PRN
Start: 2020-11-30 — End: 2020-12-01

## 2020-11-30 MED ORDER — LACTATED RINGERS IV SOLN: 500.0000 mL | INTRAVENOUS | Status: DC | PRN

## 2020-11-30 MED ORDER — ACETAMINOPHEN 325 MG PO TABS: 650.0000 mg | ORAL_TABLET | ORAL | Status: DC | PRN

## 2020-11-30 MED ORDER — OXYTOCIN BOLUS FROM INFUSION
333.0000 mL | Freq: Once | INTRAVENOUS | Status: AC
  Administered 2020-12-01: 333 mL via INTRAVENOUS

## 2020-11-30 MED ORDER — DIPHENHYDRAMINE HCL 50 MG/ML IJ SOLN: 12.5000 mg | INTRAMUSCULAR | Status: DC | PRN

## 2020-11-30 MED ORDER — LACTATED RINGERS IV SOLN
INTRAVENOUS | Status: DC
Start: 2020-11-30 — End: 2020-12-01

## 2020-11-30 MED ORDER — OXYCODONE-ACETAMINOPHEN 5-325 MG PO TABS: 2.0000 | ORAL_TABLET | ORAL | Status: DC | PRN

## 2020-11-30 MED ORDER — OXYTOCIN-SODIUM CHLORIDE 30-0.9 UT/500ML-% IV SOLN
1.0000 m[IU]/min | INTRAVENOUS | Status: DC
  Administered 2020-11-30: 2 m[IU]/min via INTRAVENOUS
  Filled 2020-11-30: qty 500

## 2020-11-30 MED ORDER — LACTATED RINGERS IV SOLN
500.0000 mL | Freq: Once | INTRAVENOUS | Status: AC
  Administered 2020-11-30: 500 mL via INTRAVENOUS

## 2020-11-30 MED ORDER — FENTANYL CITRATE (PF) 100 MCG/2ML IJ SOLN
100.0000 ug | INTRAMUSCULAR | Status: DC | PRN
  Administered 2020-11-30: 100 ug via INTRAVENOUS
  Administered 2020-11-30: 12 ug via INTRAVENOUS
  Administered 2020-11-30 (×2): 100 ug via INTRAVENOUS
  Filled 2020-11-30 (×3): qty 2

## 2020-11-30 MED ORDER — SOD CITRATE-CITRIC ACID 500-334 MG/5ML PO SOLN: 30.0000 mL | ORAL | Status: DC | PRN

## 2020-11-30 MED ORDER — PHENYLEPHRINE 40 MCG/ML (10ML) SYRINGE FOR IV PUSH (FOR BLOOD PRESSURE SUPPORT): 80.0000 ug | PREFILLED_SYRINGE | INTRAVENOUS | Status: DC | PRN

## 2020-11-30 MED ORDER — OXYTOCIN-SODIUM CHLORIDE 30-0.9 UT/500ML-% IV SOLN
2.5000 [IU]/h | INTRAVENOUS | Status: DC
  Administered 2020-12-01: 2.5 [IU]/h via INTRAVENOUS

## 2020-11-30 MED ORDER — FENTANYL-BUPIVACAINE-NACL 0.5-0.125-0.9 MG/250ML-% EP SOLN
EPIDURAL | Status: AC
  Filled 2020-11-30: qty 250

## 2020-11-30 MED ORDER — PHENYLEPHRINE 40 MCG/ML (10ML) SYRINGE FOR IV PUSH (FOR BLOOD PRESSURE SUPPORT)
80.0000 ug | PREFILLED_SYRINGE | INTRAVENOUS | Status: DC | PRN
  Filled 2020-11-30: qty 10

## 2020-11-30 NOTE — Progress Notes (Signed)
Patient ID: Leah Martin, female   DOB: Apr 27, 1996, 25 y.o.   MRN: [id number] Patient is now comfortable with epidural  Vitals:   11/30/20 1838 11/30/20 1911 11/30/20 1930 11/30/20 2000  BP:  (!) 107/59 (!) 104/56 106/63  Pulse:  82 85 78  Resp:  16  16  Temp: 98.8 F (37.1 C)     TempSrc: Oral     SpO2:      Weight:      Height:       FHR 145, mod variability UC q2.5 min  Dilation: 5.5 Effacement (%): 80 Station: -1,0 Presentation: Vertex Exam by:: Mellissa Kohut, RN  Will continue to observe.  Consider Pitocin augmentation prn

## 2020-11-30 NOTE — H&P (Addendum)
OBSTETRIC ADMISSION HISTORY AND PHYSICAL  Leah Martin is a 25 y.o. female G2P0010 with IUP at [redacted]w[redacted]d presenting for SROM at 10am and ctx. She reports +FMs. No VB, blurry vision, headaches, peripheral edema, or RUQ pain. She plans on formula feeding. She requests Depo for birth control.  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  Past Medical History: Past Medical History:  Diagnosis Date   Medical history non-contributory     Past Surgical History: Past Surgical History:  Procedure Laterality Date   DILATION AND CURETTAGE OF UTERUS N/A 08/09/2019   Procedure: SUCTION DILATATION AND CURETTAGE;  Surgeon: 08/11/2019, MD;  Location: [facility] OR;  Service: Gynecology;  Laterality: N/A;   NO PAST SURGERIES      Obstetrical History: OB History     Gravida  2   Para      Term      Preterm      AB  1   Living         SAB  1   IAB      Ectopic      Multiple      Live Births              Social History: Social History   Socioeconomic History   Marital status: Single    Spouse name: Not on file   Number of children: Not on file   Years of education: Not on file   Highest education level: High school graduate  Occupational History   Occupation: unemployed  Tobacco Use   Smoking status: Never Smoker   Smokeless tobacco: Never Used  [facility] Use: Never used  Substance and Sexual Activity   Alcohol use: No   Drug use: No   Sexual activity: Yes    Birth control/protection: None  Other Topics Concern   Not on file  Social History Narrative   Not on file   Social Determinants of Health   Financial Resource Strain: Not on file  Food Insecurity: Not on file  Transportation Needs: Not on file  Physical Activity: Not on file  Stress: Not on file  Social Connections: Not on file    Family  History: Family History  Problem Relation Age of Onset   Kidney disease Mother     Allergies: No Known Allergies  Medications Prior to Admission  Medication Sig Dispense Refill Last Dose   Prenatal Vit-Fe Fumarate-FA (PRENATAL PLUS/IRON) 27-1 MG TABS Take 1 tablet by mouth daily. 30 tablet 12 11/29/2020 at Unknown time   ondansetron (ZOFRAN ODT) 4 MG disintegrating tablet Take 1 tablet (4 mg total) by mouth every 8 (eight) hours as needed for nausea or vomiting. (Patient not taking: Reported on 11/28/2020) 20 tablet 0    terconazole (TERAZOL 7) 0.4 % vaginal cream Place 1 applicator vaginally at bedtime. Bedtime for 3 days. (Patient not taking: No sig reported) 45 g 0      Review of Systems:  All systems reviewed and negative except as stated in HPI  PE: Blood pressure 121/67, pulse 87, temperature 98.6 F (37 C), temperature source Oral, resp. rate 16, height 5\' 2"  (1.575 m), weight 72.6 kg, last menstrual period 02/26/2020, SpO2 99 %. General appearance: alert, cooperative and no distress Lungs: regular rate and effort Heart: regular rate  Abdomen: soft, non-tender Extremities:  sign is negative, no sign of DVT Presentation: cephalic by BSUS EFM: 140 bpm, mod variability, + accels, no decels Toco: 2-5 Dilation: 1.5 Station: -2 Exam by:: Silvana Newness, RN SSE: +pool, fern pos  Prenatal labs: ABO, Rh: B/Positive/-- (09/01 1553) Antibody: Negative (09/01 1553) Rubella: 3.29 (09/01 1553) RPR: Non Reactive (01/06 0946)  HBsAg: Negative (09/01 1553)  HIV: Non Reactive (01/06 0946)  GBS: Negative/-- (02/24 1638)  2 hr GTT nml  Prenatal Transfer Tool  Maternal Diabetes: No Genetic Screening: Declined Maternal Ultrasounds/Referrals: Normal Fetal Ultrasounds or other Referrals:  None Maternal Substance Abuse:  No Significant Maternal Medications:  None Significant Maternal Lab Results: Group B Strep negative  Results for orders placed or performed during the  hospital encounter of 11/30/20 (from the past 24 hour(s))  Fern Test   Collection Time: 11/30/20 12:41 PM  Result Value Ref Range   POCT Fern Test      Patient Active Problem List   Diagnosis Date Noted   COVID-19 affecting pregnancy in third trimester 10/01/2020   LGSIL on Pap smear of cervix 05/17/2020   Supervision of other normal pregnancy, antepartum 04/30/2020   Fever of unknown origin 08/09/2019    Assessment: Leah Martin is a 25 y.o. G2P0010 at [redacted]w[redacted]d here for SROM and early labor  1. Labor: latent 2. FWB: Cat I 3. Pain: analgesia/anesthesia prn 4. GBS: neg   Plan: Admit to LD Expectant mngt  Anticipate SVD  Donette Larry, CNM  11/30/2020, 12:44 PM  Attestation of Attending Supervision of Advanced Practice Provider (PA/CNM/NP): Evaluation and management procedures were performed by the Advanced Practice Provider under my supervision and collaboration.  I have reviewed the Advanced Practice Provider's note and chart, and I agree with the management and plan. I have also made any necessary editorial changes.   Sharon Seller, DO Attending Obstetrician & Gynecologist, [facility] for [facility], [facility] 11/30/2020 1:24 PM

## 2020-11-30 NOTE — Progress Notes (Signed)
Patient ID: Leah Martin, female   DOB: 30-Sep-1995, 25 y.o.   MRN: [id number] Recheck of cervix by RN is unchanged  Vitals:   11/30/20 2030 11/30/20 2100 11/30/20 2130 11/30/20 2200  BP: 109/80 (!) 108/54 105/73 (!) 98/53  Pulse: (!) 101 (!) 123 (!) 103 (!) 118  Resp: 18 18 16    Temp:      TempSrc:      SpO2:      Weight:      Height:       FHR reassuring UCs q64min  Will start Pitocin 2x2

## 2020-11-30 NOTE — Anesthesia Procedure Notes (Signed)
Epidural Patient location during procedure: OB Start time: 11/30/2020 5:04 PM End time: 11/30/2020 5:21 PM  Staffing Anesthesiologist: Lowella Curb, MD Performed: anesthesiologist   Preanesthetic Checklist Completed: patient identified, IV checked, site marked, risks and benefits discussed, surgical consent, monitors and equipment checked, pre-op evaluation and timeout performed  Epidural Patient position: sitting Prep: ChloraPrep Patient monitoring: heart rate, cardiac monitor, continuous pulse ox and blood pressure Approach: midline Location: L2-L3 Injection technique: LOR saline  Needle:  Needle type: Tuohy  Needle gauge: 17 G Needle length: 9 cm Needle insertion depth: 6 cm Catheter type: closed end flexible Catheter size: 20 Guage Catheter at skin depth: 10 cm Test dose: negative  Assessment Events: blood not aspirated, injection not painful, no injection resistance, no paresthesia and negative IV test  Additional Notes Reason for block:procedure for pain

## 2020-11-30 NOTE — MAU Note (Signed)
Pt arrived via EMS with complaint of ctx starting at 400 am and now every 2 minutes apart; pt reports SROM @ 1000, clear fluid. No complication this pregnancy per patient. Reports +FM and no vaginal bleeding; pain 10 on 1-10 scale during contractions.

## 2020-11-30 NOTE — Anesthesia Preprocedure Evaluation (Signed)
Anesthesia Evaluation  Patient identified by MRN, date of birth, ID band Patient awake    Reviewed: Allergy & Precautions, NPO status , Patient's Chart, lab work & pertinent test results  Airway Mallampati: II  TM Distance: >3 FB     Dental  (+) Dental Advisory Given   Pulmonary neg pulmonary ROS,    breath sounds clear to auscultation       Cardiovascular negative cardio ROS   Rhythm:Regular Rate:Normal     Neuro/Psych negative neurological ROS     GI/Hepatic negative GI ROS, Neg liver ROS,   Endo/Other  negative endocrine ROS  Renal/GU negative Renal ROS     Musculoskeletal   Abdominal   Peds  Hematology negative hematology ROS (+)   Anesthesia Other Findings   Reproductive/Obstetrics (+) Pregnancy                             Anesthesia Physical  Anesthesia Plan  ASA: II  Anesthesia Plan: Epidural   Post-op Pain Management:    Induction:   PONV Risk Score and Plan: 3 and Dexamethasone, Ondansetron and Treatment may vary due to age or medical condition  Airway Management Planned: Natural Airway  Additional Equipment:   Intra-op Plan:   Post-operative Plan:   Informed Consent: I have reviewed the patients History and Physical, chart, labs and discussed the procedure including the risks, benefits and alternatives for the proposed anesthesia with the patient or authorized representative who has indicated his/her understanding and acceptance.     Dental advisory given  Plan Discussed with:   Anesthesia Plan Comments:         Anesthesia Quick Evaluation

## 2020-12-01 ENCOUNTER — Encounter (HOSPITAL_COMMUNITY): Payer: Self-pay | Admitting: Obstetrics & Gynecology

## 2020-12-01 DIAGNOSIS — Z3A39 39 weeks gestation of pregnancy: Secondary | ICD-10-CM

## 2020-12-01 LAB — RPR: RPR Ser Ql: NONREACTIVE

## 2020-12-01 MED ORDER — TETANUS-DIPHTH-ACELL PERTUSSIS 5-2.5-18.5 LF-MCG/0.5 IM SUSY: 0.5000 mL | PREFILLED_SYRINGE | Freq: Once | INTRAMUSCULAR | Status: DC

## 2020-12-01 MED ORDER — ACETAMINOPHEN 325 MG PO TABS: 650.0000 mg | ORAL_TABLET | ORAL | Status: DC | PRN

## 2020-12-01 MED ORDER — COCONUT OIL OIL: 1.0000 "application " | TOPICAL_OIL | Status: DC | PRN

## 2020-12-01 MED ORDER — DIPHENHYDRAMINE HCL 25 MG PO CAPS: 25.0000 mg | ORAL_CAPSULE | Freq: Four times a day (QID) | ORAL | Status: DC | PRN

## 2020-12-01 MED ORDER — PRENATAL MULTIVITAMIN CH
1.0000 | ORAL_TABLET | Freq: Every day | ORAL | Status: DC
  Administered 2020-12-02 – 2020-12-03 (×2): 1 via ORAL
  Filled 2020-12-01 (×2): qty 1

## 2020-12-01 MED ORDER — DIBUCAINE (PERIANAL) 1 % EX OINT: 1.0000 "application " | TOPICAL_OINTMENT | CUTANEOUS | Status: DC | PRN

## 2020-12-01 MED ORDER — ONDANSETRON HCL 4 MG PO TABS: 4.0000 mg | ORAL_TABLET | ORAL | Status: DC | PRN

## 2020-12-01 MED ORDER — SIMETHICONE 80 MG PO CHEW: 80.0000 mg | CHEWABLE_TABLET | ORAL | Status: DC | PRN

## 2020-12-01 MED ORDER — GENTAMICIN SULFATE 40 MG/ML IJ SOLN
5.0000 mg/kg | INTRAVENOUS | Status: DC
  Administered 2020-12-01: 300 mg via INTRAVENOUS
  Filled 2020-12-01: qty 7.5

## 2020-12-01 MED ORDER — IBUPROFEN 600 MG PO TABS
600.0000 mg | ORAL_TABLET | Freq: Four times a day (QID) | ORAL | Status: DC
  Administered 2020-12-01 – 2020-12-03 (×9): 600 mg via ORAL
  Filled 2020-12-01 (×8): qty 1

## 2020-12-01 MED ORDER — WITCH HAZEL-GLYCERIN EX PADS: 1.0000 "application " | MEDICATED_PAD | CUTANEOUS | Status: DC | PRN

## 2020-12-01 MED ORDER — SODIUM CHLORIDE 0.9 % IV SOLN
2.0000 g | Freq: Four times a day (QID) | INTRAVENOUS | Status: DC
  Administered 2020-12-01: 2 g via INTRAVENOUS
  Filled 2020-12-01: qty 2000

## 2020-12-01 MED ORDER — BENZOCAINE-MENTHOL 20-0.5 % EX AERO: 1.0000 "application " | INHALATION_SPRAY | CUTANEOUS | Status: DC | PRN

## 2020-12-01 MED ORDER — MEDROXYPROGESTERONE ACETATE 150 MG/ML IM SUSP
150.0000 mg | Freq: Once | INTRAMUSCULAR | Status: AC
  Administered 2020-12-03: 150 mg via INTRAMUSCULAR
  Filled 2020-12-01: qty 1

## 2020-12-01 MED ORDER — SENNOSIDES-DOCUSATE SODIUM 8.6-50 MG PO TABS
2.0000 | ORAL_TABLET | Freq: Every day | ORAL | Status: DC
  Administered 2020-12-02 – 2020-12-03 (×2): 2 via ORAL
  Filled 2020-12-01 (×2): qty 2

## 2020-12-01 MED ORDER — ZOLPIDEM TARTRATE 5 MG PO TABS: 5.0000 mg | ORAL_TABLET | Freq: Every evening | ORAL | Status: DC | PRN

## 2020-12-01 MED ORDER — ONDANSETRON HCL 4 MG/2ML IJ SOLN: 4.0000 mg | INTRAMUSCULAR | Status: DC | PRN

## 2020-12-01 NOTE — Progress Notes (Signed)
Patient ID: Leah Martin, female   DOB: Feb 12, 1996, 25 y.o.   MRN: [id number] Started pushing at 0700  Feeling a lot of back pain now Vomited >> Zofran given Vitals:   12/01/20 0500 12/01/20 0630 12/01/20 0646 12/01/20 0700  BP: 110/64 128/83  127/67  Pulse: 95 94  (!) 109  Resp:  16    Temp:   100.1 F (37.8 C)   TempSrc:   Axillary   SpO2:      Weight:      Height:       Now has intrapartum fever Will start Amp and Gent per protocol

## 2020-12-01 NOTE — Discharge Summary (Signed)
Postpartum Discharge Summary     Patient Name: Leah Martin DOB: 09-Oct-1995 MRN: [id number]  Date of admission: 11/30/2020 Delivery date:12/01/2020  Delivering provider: Randa Ngo  Date of discharge: 12/03/2020  Admitting diagnosis: Indication for care in labor or delivery [O75.9] Intrauterine pregnancy: [redacted]w[redacted]d    Secondary diagnosis:  Principal Problem:   Vaginal delivery Active Problems:   COVID-19 affecting pregnancy in third trimester   Indication for care in labor or delivery SuspectedTriple I Additional problems: as noted above   Discharge diagnosis: Term Pregnancy Delivered                                              Post partum procedures:none Augmentation: Pitocin Complications: Shoulder dystocia (15 sec)  Hospital course: Onset of Labor With Vaginal Delivery      25y.o. yo G2P1011 at 317w6das admitted in Latent Labor s/p SROM on 11/30/2020. Patient had an uncomplicated labor course as follows:  Membrane Rupture Time/Date: 1000 on 11/30/20 Delivery Method:Vaginal, Spontaneous  Episiotomy: None  Lacerations:  1st degree   Hgb dropped from 12.1>8.8 (PPH in MB room, unclear as to EBL).  Oral iron started.  She is ambulating, tolerating a regular diet, passing flatus, and urinating well. Patient is discharged home in stable condition on 12/03/20.  Newborn Data: Birth date:12/01/2020  Birth time:9:40 AM  Gender:Female  Living status:Living  Apgars:8 ,9  Weight:3605 g   Magnesium Sulfate received: No BMZ received: No Rhophylac:N/A MMR:N/A T-DaP: offered prior to discharge Flu: Yes Transfusion:No  Physical exam  Vitals:   12/02/20 1351 12/02/20 1405 12/02/20 2110 12/03/20 0514  BP: (!) 95/40 (!) 98/53 124/76 (!) 103/52  Pulse: 67  68 79  Resp: 17  18 16   Temp: 98 F (36.7 C)  98.2 F (36.8 C) 98.3 F (36.8 C)  TempSrc: Oral  Oral Oral  SpO2:   100% 99%  Weight:      Height:       General: alert, cooperative and no distress Lochia:  appropriate Uterine Fundus: firm Incision: N/A DVT Evaluation: No evidence of DVT seen on physical exam. Labs: Lab Results  Component Value Date   WBC 13.5 (H) 12/02/2020   HGB 8.8 (L) 12/02/2020   HCT 26.3 (L) 12/02/2020   MCV 94.6 12/02/2020   PLT 228 12/02/2020   CMP Latest Ref Rng & Units 10/01/2020  Glucose 70 - 99 mg/dL 88  BUN 6 - 20 mg/dL 6  Creatinine 0.44 - 1.00 mg/dL 0.49  Sodium 135 - 145 mmol/L 133(L)  Potassium 3.5 - 5.1 mmol/L 3.5  Chloride 98 - 111 mmol/L 104  CO2 22 - 32 mmol/L 20(L)  Calcium 8.9 - 10.3 mg/dL 8.1(L)  Total Protein 6.5 - 8.1 g/dL 5.7(L)  Total Bilirubin 0.3 - 1.2 mg/dL 0.4  Alkaline Phos 38 - 126 U/L 61  AST 15 - 41 U/L 49(H)  ALT 0 - 44 U/L 48(H)   EdFlavia Shippercore: Edinburgh Postnatal Depression Scale Screening Tool 12/01/2020  I have been able to laugh and see the funny side of things. 0  I have looked forward with enjoyment to things. 0  I have blamed myself unnecessarily when things went wrong. 0  I have been anxious or worried for no good reason. 1  I have felt scared or panicky for no good reason. 0  Things have been getting  on top of me. 0  I have been so unhappy that I have had difficulty sleeping. 0  I have felt sad or miserable. 0  I have been so unhappy that I have been crying. 0  The thought of harming myself has occurred to me. 0  Edinburgh Postnatal Depression Scale Total 1     After visit meds:  Allergies as of 12/03/2020   No Known Allergies     Medication List    STOP taking these medications   esomeprazole 20 MG capsule Commonly known as: NEXIUM   ondansetron 4 MG disintegrating tablet Commonly known as: Zofran ODT   terconazole 0.4 % vaginal cream Commonly known as: TERAZOL 7     TAKE these medications   ferrous sulfate 325 (65 FE) MG tablet Take 1 tablet (325 mg total) by mouth every other day. Start taking on: December 04, 2020   ibuprofen 600 MG tablet Commonly known as: ADVIL Take 1 tablet (600 mg  total) by mouth every 6 (six) hours.   Prenatal Plus/Iron 27-1 MG Tabs Take 1 tablet by mouth daily.        Discharge home in stable condition Infant Feeding: Bottle Infant Disposition:home with mother Discharge instruction: per After Visit Summary and Postpartum booklet. Activity: Advance as tolerated. Pelvic rest for 6 weeks.  Diet: routine diet Future Appointments: Future Appointments  Date Time Provider [place]  01/15/2021  1:10 PM Rasch, Artist Pais, NP [facility] None   Follow up Visit:  Follow-up Information    [facility] up on 01/15/2021.   Specialty: Obstetrics and Gynecology Why: for postpartum checkup Contact information: [place] [phone]             Message sent to [facility] by Dr. Astrid Drafts.  Please schedule this patient for a In person postpartum visit in 6 weeks with the following provider: Any provider. Additional Postpartum F/U:none  Low risk pregnancy complicated by: none Delivery mode:  Vaginal, Spontaneous  Anticipated Birth Control:  Depo (wants at [facility] visit)  12/03/2020 Gifford Shave, MD

## 2020-12-01 NOTE — Progress Notes (Signed)
M Early in room

## 2020-12-01 NOTE — Progress Notes (Signed)
Pt still refusing to push at this time

## 2020-12-01 NOTE — Progress Notes (Addendum)
Patient ID: Leah Martin, female   DOB: 1995/10/14, 25 y.o.   MRN: [id number] Completely dilated now, but feeling no pressure  Vitals:   12/01/20 0300 12/01/20 0331 12/01/20 0400 12/01/20 0500  BP: 124/74 124/82 (!) 108/57 110/64  Pulse: (!) 118 (!) 109 (!) 117 95  Resp:  16    Temp:      TempSrc:      SpO2:      Weight:      Height:       FHR reassuring but has small variable decels with UCs  Dilation: 10 Dilation Complete Date: 12/01/20 Dilation Complete Time: 0505 Effacement (%): 100 Cervical Position: Anterior Station: 0,Plus 1 Presentation: Vertex Exam by:: Patryce Depriest CNM  Attempted pushing for 5 UCs or so, no movement of vertex with pushing Will labor down an hour and resume pushing Put in high fowlers for now  RN had stopped Pitocin for variable decels Will restart it now.

## 2020-12-01 NOTE — Progress Notes (Signed)
Pharmacy Antibiotic Note  Leah Martin is a 25 y.o. female admitted on 11/30/2020 with intrapartum fever. Pharmacy has been consulted for gentamicin dosing.  Plan: Start gentamicin 5 mg/kg IV q24h. Continue to follow plan for length of therapy and obtain levels if needed.   Height: 5\' 2"  (157.5 cm) Weight: 72.6 kg (160 lb) IBW/kg (Calculated) : 50.1  Temp (24hrs), Avg:98.9 F (37.2 C), Min:98.1 F (36.7 C), Max:100.1 F (37.8 C)  Recent Labs  Lab 11/30/20 1303  WBC 9.5    CrCl cannot be calculated (Patient's most recent lab result is older than the maximum 21 days allowed.).    No Known Allergies  Antimicrobials this admission: Ampicillin 2g q8h (3/20>>  Thank you for allowing pharmacy to be a part of this patient's care.  12/02/20, PharmD, MHSA, BCPPS 12/01/2020 7:30 AM

## 2020-12-01 NOTE — Progress Notes (Signed)
Patient ID: Leah Martin, female   DOB: 11-08-1995, 25 y.o.   MRN: [id number] Feeling some pressure with contractions  Vitals:   12/01/20 0030 12/01/20 0101 12/01/20 0130 12/01/20 0200  BP: 111/68 (!) 97/44 (!) 94/52 122/83  Pulse: 92 95 84   Resp: 16 16 16 16   Temp:  98.7 F (37.1 C)    TempSrc:  Oral    SpO2:      Weight:      Height:       FHR reactive Dilation: 9 Effacement (%): 100 Station: Plus 1 Presentation: Vertex Exam by:: Hopkins RN  Anticipate SVD soon

## 2020-12-01 NOTE — Anesthesia Postprocedure Evaluation (Signed)
Anesthesia Post Note  Patient: Leah Martin  Procedure(s) Performed: AN AD HOC LABOR EPIDURAL     Patient location during evaluation: Mother Baby Anesthesia Type: Epidural Level of consciousness: awake and alert, oriented and patient cooperative Pain management: pain level controlled Vital Signs Assessment: post-procedure vital signs reviewed and stable Respiratory status: spontaneous breathing Cardiovascular status: stable Postop Assessment: no headache, epidural receding, patient able to bend at knees and no signs of nausea or vomiting Anesthetic complications: no Comments: Pt. States pain score 6 and pain is manageable.  No need for additional pain meds.    No complications documented.  Last Vitals:  Vitals:   12/01/20 1206 12/01/20 1315  BP: 116/66 109/71  Pulse: 83 89  Resp: 17 18  Temp: 37.5 C 37.2 C  SpO2:      Last Pain:  Vitals:   12/01/20 1315  TempSrc: Oral  PainSc: 2    Pain Goal: Patients Stated Pain Goal: 4 (12/01/20 1315)                 Merrilyn Puma

## 2020-12-01 NOTE — Progress Notes (Signed)
Pt would like to start pushing

## 2020-12-01 NOTE — Progress Notes (Signed)
First Attending called to evaluate patient at bedside

## 2020-12-01 NOTE — Discharge Instructions (Signed)

## 2020-12-01 NOTE — Plan of Care (Signed)
Pt demonstrated understanding 

## 2020-12-02 LAB — CBC
HCT: 26.3 % — ABNORMAL LOW (ref 36.0–46.0)
Hemoglobin: 8.8 g/dL — ABNORMAL LOW (ref 12.0–15.0)
MCH: 31.7 pg (ref 26.0–34.0)
MCHC: 33.5 g/dL (ref 30.0–36.0)
MCV: 94.6 fL (ref 80.0–100.0)
Platelets: 228 10*3/uL (ref 150–400)
RBC: 2.78 MIL/uL — ABNORMAL LOW (ref 3.87–5.11)
RDW: 14.2 % (ref 11.5–15.5)
WBC: 13.5 10*3/uL — ABNORMAL HIGH (ref 4.0–10.5)
nRBC: 0 % (ref 0.0–0.2)

## 2020-12-02 MED ORDER — IBUPROFEN 600 MG PO TABS: 600.0000 mg | ORAL_TABLET | Freq: Four times a day (QID) | ORAL | 0 refills | Status: DC

## 2020-12-02 MED ORDER — FERROUS SULFATE 325 (65 FE) MG PO TABS
325.0000 mg | ORAL_TABLET | ORAL | Status: DC
  Administered 2020-12-02: 325 mg via ORAL
  Filled 2020-12-02: qty 1

## 2020-12-02 NOTE — Discharge Summary (Signed)
Postpartum Discharge Summary  Date of Service updated 12/02/20    Patient Name: Leah Martin DOB: 26-Jun-1996 MRN: [id number]  Date of admission: 11/30/2020 Delivery date:12/01/2020  Delivering provider: Randa Ngo  Date of discharge: 12/02/2020  Admitting diagnosis: Indication for care in labor or delivery [O75.9] Intrauterine pregnancy: [redacted]w[redacted]d    Secondary diagnosis:  Principal Problem:   Vaginal delivery Active Problems:   COVID-19 affecting pregnancy in third trimester   Indication for care in labor or delivery  Additional problems: None     Discharge diagnosis: Term Pregnancy Delivered                                              Post partum procedures: none Augmentation: Pitocin Complications: None  Hospital course: Induction of Labor With Vaginal Delivery   25y.o. yo G2P1011 at 365w6das admitted to the hospital 11/30/2020 for induction of labor after SROM. Patient had an uncomplicated labor course as follows: Membrane Rupture Time/Date:  ,   Delivery Method:Vaginal, Spontaneous  Episiotomy: None  Lacerations:  1st degree  Details of delivery can be found in separate delivery note.  Patient had a routine postpartum course. Patient is discharged home 12/02/20.  Newborn Data: Birth date:12/01/2020  Birth time:9:40 AM  Gender:Female  Living status:Living  Apgars:8 ,9  Weight:3605 g   Magnesium Sulfate received: No BMZ received: No Rhophylac:N/A MMR:N/A T-DaP:Given postpartum Flu: N/A Transfusion:No  Physical exam  Vitals:   12/01/20 1315 12/01/20 1749 12/01/20 2140 12/02/20 0120  BP: 109/71 104/68 101/62 106/73  Pulse: 89 80 84 76  Resp: 18 18 16 16   Temp: 99 F (37.2 C) 98.5 F (36.9 C) 98.7 F (37.1 C) 98.1 F (36.7 C)  TempSrc: Oral Oral Oral Oral  SpO2:   99% 99%  Weight:      Height:       General: alert and cooperative Lochia: appropriate Uterine Fundus: firm Incision: Healing well with no significant drainage DVT Evaluation:  No evidence of DVT seen on physical exam. Labs: Lab Results  Component Value Date   WBC 13.5 (H) 12/02/2020   HGB 8.8 (L) 12/02/2020   HCT 26.3 (L) 12/02/2020   MCV 94.6 12/02/2020   PLT 228 12/02/2020   CMP Latest Ref Rng & Units 10/01/2020  Glucose 70 - 99 mg/dL 88  BUN 6 - 20 mg/dL 6  Creatinine 0.44 - 1.00 mg/dL 0.49  Sodium 135 - 145 mmol/L 133(L)  Potassium 3.5 - 5.1 mmol/L 3.5  Chloride 98 - 111 mmol/L 104  CO2 22 - 32 mmol/L 20(L)  Calcium 8.9 - 10.3 mg/dL 8.1(L)  Total Protein 6.5 - 8.1 g/dL 5.7(L)  Total Bilirubin 0.3 - 1.2 mg/dL 0.4  Alkaline Phos 38 - 126 U/L 61  AST 15 - 41 U/L 49(H)  ALT 0 - 44 U/L 48(H)   EdFlavia Shippercore: Edinburgh Postnatal Depression Scale Screening Tool 12/01/2020  I have been able to laugh and see the funny side of things. 0  I have looked forward with enjoyment to things. 0  I have blamed myself unnecessarily when things went wrong. 0  I have been anxious or worried for no good reason. 1  I have felt scared or panicky for no good reason. 0  Things have been getting on top of me. 0  I have been so unhappy that I have  had difficulty sleeping. 0  I have felt sad or miserable. 0  I have been so unhappy that I have been crying. 0  The thought of harming myself has occurred to me. 0  Edinburgh Postnatal Depression Scale Total 1      After visit meds:  Allergies as of 12/02/2020   No Known Allergies     Medication List    STOP taking these medications   ondansetron 4 MG disintegrating tablet Commonly known as: Zofran ODT   terconazole 0.4 % vaginal cream Commonly known as: TERAZOL 7     TAKE these medications   ibuprofen 600 MG tablet Commonly known as: ADVIL Take 1 tablet (600 mg total) by mouth every 6 (six) hours.   Prenatal Plus/Iron 27-1 MG Tabs Take 1 tablet by mouth daily.        Discharge home in stable condition Infant Feeding: Bottle and Breast Infant Disposition:home with mother Discharge instruction: per  After Visit Summary and Postpartum booklet. Activity: Advance as tolerated. Pelvic rest for 6 weeks.  Diet: routine diet  Anticipated Birth Control: Depo Postpartum Appointment:6 weeks Additional Postpartum F/U:  Future Appointments: Future Appointments  Date Time Provider [place]  01/15/2021  1:10 PM Rasch, Artist Pais, NP [facility] None   12/02/2020 Brooks Sailors, MD

## 2020-12-02 NOTE — Progress Notes (Addendum)
POSTPARTUM PROGRESS NOTE  Post Partum Day 1  Subjective:  Leah Martin is a 25 y.o. G2P1011 s/p SVD at [redacted]w[redacted]d.  No acute events overnight.  Pt denies problems with ambulating, voiding or po intake.  She denies nausea or vomiting.  Pain is well controlled.  She has had flatus.   Lochia Minimal.   Objective: Blood pressure 106/73, pulse 76, temperature 98.1 F (36.7 C), temperature source Oral, resp. rate 16, height 5\' 2"  (1.575 m), weight 72.6 kg, last menstrual period 02/26/2020, SpO2 99 %, unknown if currently breastfeeding.  Physical Exam:  General: alert, cooperative and no distress Chest: no respiratory distress Heart:regular rate, distal pulses intact Abdomen: soft, nontender,  Uterine Fundus: firm, appropriately tender DVT Evaluation: No calf swelling or tenderness Extremities: None edema Skin: warm, dry  Recent Labs    11/30/20 1303 12/02/20 0406  HGB 12.2 8.8*  HCT 34.3* 26.3*    Assessment/Plan: Leah Martin is a 25 y.o. G2P1011 s/p SVD at [redacted]w[redacted]d   PPD# 1 - Doing well Contraception: Depo, would like to get in the office. Feeding: Bottle  Dispo: Plan for discharge tomorrow, baby is not discharged from Peds. G1, will keep 48 hours.  #Iron every other day.    LOS: 2 days   [redacted]w[redacted]d I, NP 12/02/2020, 11:33 AM

## 2020-12-03 MED ORDER — FERROUS SULFATE 325 (65 FE) MG PO TABS: 325.0000 mg | ORAL_TABLET | ORAL | 1 refills | Status: DC

## 2020-12-04 ENCOUNTER — Encounter: Payer: Medicaid Other | Admitting: Women's Health

## 2020-12-05 ENCOUNTER — Encounter: Payer: Medicaid Other | Admitting: Certified Nurse Midwife

## 2021-01-15 ENCOUNTER — Telehealth (INDEPENDENT_AMBULATORY_CARE_PROVIDER_SITE_OTHER): Payer: Medicaid Other | Admitting: Obstetrics and Gynecology

## 2021-01-15 ENCOUNTER — Other Ambulatory Visit: Payer: Self-pay

## 2021-01-15 VITALS — Wt 165.0 lb

## 2021-01-15 DIAGNOSIS — Z3042 Encounter for surveillance of injectable contraceptive: Secondary | ICD-10-CM

## 2021-01-15 MED ORDER — MEDROXYPROGESTERONE ACETATE 150 MG/ML IM SUSP: 150.0000 mg | INTRAMUSCULAR | 3 refills | Status: AC

## 2021-01-15 NOTE — Progress Notes (Signed)
TELEHEALTH POSTPARTUM VISIT ENCOUNTER NOTE  Provider location: [facility]   I connected with@ on 01/15/21 at  1:10 PM EDT by telephone at home and verified that I am speaking with the correct person using two identifiers.   I discussed the limitations, risks, security and privacy concerns of performing an evaluation and management service by telephone and the availability of in person appointments. I also discussed with the patient that there may be a patient responsible charge related to this service. The patient expressed understanding and agreed to proceed.  Appointment Date: 01/15/2021  OBGYN Clinic: [facility]   Chief Complaint:  Postpartum Visit  History of Present Illness: Leah Martin is a 25 y.o. Caucasian G2P1011 (Patient's last menstrual period was 02/26/2020 (exact date).), seen for the above chief complaint. Her past medical history is significant for LGSIL on pap smear in 05/2020.   She is s/p normal spontaneous vaginal delivery on 12/02/2018 at 39.6 weeks; she was discharged to home on 12/02/20, D#2. Pregnancy complicated by None. Baby is doing well.   Complains of None  Vaginal bleeding or discharge: No  Mode of feeding infant: Bottle Intercourse: Yes  Contraception: Depo-Provera PP depression s/s: No .  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  Her 12 point review of systems is negative or as noted in the History of Present Illness.  Patient Active Problem List   Diagnosis Date Noted  . Vaginal delivery 12/01/2020  . Indication for care in labor or delivery 11/30/2020  . COVID-19 affecting pregnancy in third trimester 10/01/2020  . LGSIL on Pap smear of cervix 05/17/2020  . Supervision of other normal pregnancy, antepartum 04/30/2020  . Fever of unknown origin 08/09/2019     Medications Sherilyn Cooter had no medications administered during this visit. Current Outpatient Medications  Medication Sig Dispense Refill  . medroxyPROGESTERone (DEPO-PROVERA) 150 MG/ML injection Inject 1 mL (150 mg total) into the muscle every 3 (three) months. 1 mL 3   No current facility-administered medications for this visit.    Allergies Patient has no known allergies.  Physical Exam:  General:  Alert, oriented and cooperative.   Mental Status: Normal mood and affect perceived. Normal judgment and thought content.  Rest of physical exam deferred due to type of encounter  PP Depression Screening:    Edinburgh Postnatal Depression Scale - 01/15/21 1325      Edinburgh Postnatal Depression Scale:  In the Past 7 Days   I have been able to laugh and see the funny side of things. 0    I have looked forward with enjoyment to things. 0    I have blamed myself unnecessarily when things went wrong. 0    I have been anxious or worried for no good reason. 0    I have felt scared or panicky for no good reason. 0    Things have been getting on top of me. 0    I have been so unhappy that I have had difficulty sleeping. 0    I have felt sad or miserable. 0    I have been so unhappy that I have been crying. 0    The thought of harming myself has occurred to me. 0    Edinburgh Postnatal Depression Scale Total 0           Assessment:Patient  is a 25 y.o. G2P1011 who is 6 weeks postpartum from a normal spontaneous vaginal delivery.  She is doing well.   Plan:  1. Family planning, Depo-Provera contraception monitoring/administration  - medroxyPROGESTERone (DEPO-PROVERA) 150 MG/ML injection; Inject 1 mL (150 mg total) into the muscle every 3 (three) months.  Dispense: 1 mL; Refill: 3   RTC 3 months.   I discussed the assessment and treatment plan with the patient. The patient was provided an opportunity to ask questions and all were answered. The patient agreed with the  plan and demonstrated an understanding of the instructions.   The patient was advised to call back or seek an in-person evaluation/go to the ED for any concerning postpartum symptoms.  I provided 8 minutes of non-face-to-face time during this encounter.   Venia Carbon, NP [facility], [facility]             Post Partum Visit Note  Leah Martin is a 25 y.o. G69P1011 female who presents for a postpartum visit. She is 6 weeks postpartum following a normal spontaneous vaginal delivery.  I have fully reviewed the prenatal and intrapartum course. The delivery was at 39.6 gestational weeks.  Anesthesia: epidural. Postpartum course has been uncomplicated. Baby is doing well. Baby is feeding by bottle - Enfamil Neuropro. Bleeding staining only. Bowel function is normal. Bladder function is normal. Patient is sexually active. Contraception method is Depo-Provera injections. Postpartum depression screening: negative.  The pregnancy intention screening data noted above was reviewed. Potential methods of contraception were discussed. The patient elected to proceed with Hormonal Injection.    Edinburgh Postnatal Depression Scale - 01/15/21 1325      Edinburgh Postnatal Depression Scale:  In the Past 7 Days   I have been able to laugh and see the funny side of things. 0    I have looked forward with enjoyment to things. 0    I have blamed myself unnecessarily when things went wrong. 0    I have been anxious or worried for no good reason. 0    I have felt scared or panicky for no good reason. 0    Things have been getting on top of me. 0    I have been so unhappy that I have had difficulty sleeping. 0    I have felt sad or miserable. 0    I have been so unhappy that I have been crying. 0    The thought of harming myself has occurred to me. 0    Edinburgh Postnatal Depression Scale Total 0           Health Maintenance Due  Topic Date Due  . HPV  VACCINES (1 - 2-dose series) Never done  . TETANUS/TDAP  Never done    The following portions of the patient's history were reviewed and updated as appropriate: allergies, current medications, past family history, past medical history, past social history, past surgical history and problem list.  Review of Systems Pertinent items are noted in HPI.  Plan:   Essential components of care per ACOG recommendations:  1.  Mood and well being: Patient with negative depression screening today. Reviewed local resources for support.  - Patient tobacco use? Yes. Patient desires to quit? No.   - hx of drug use? No.    2. Infant care and feeding:  -Patient currently breastmilk feeding? No.  -Social determinants of health (SDOH) reviewed in EPIC. No concerns  3. Sexuality, contraception and birth spacing -  Patient does not want a pregnancy in the next year.  Desired family size is 3 children.  - Reviewed forms of contraception in tiered fashion. Patient desired Depo-Provera today.   - Discussed birth spacing of 18 months  4. Sleep and fatigue -Encouraged family/partner/community support of 4 hrs of uninterrupted sleep to help with mood and fatigue  5. Physical Recovery  - Discussed patients delivery and complications. She describes her labor as good. - Patient had a Vaginal, no problems at delivery. Patient had a 1st degree laceration. Perineal healing reviewed. Patient expressed understanding - Patient has urinary incontinence? No. - Patient is safe to resume physical and sexual activity  6.  Health Maintenance - HM due items addressed Yes - Last pap smear  Diagnosis  Date Value Ref Range Status  05/15/2020 - Low grade squamous intraepithelial lesion (LSIL) (A)  Final   Pap smear not done at today's visit.  -Breast Cancer screening indicated? No.   7. Chronic Disease/Pregnancy Condition follow up: None  - PCP follow up  Venia Carbon, NP [facility], [facility]

## 2021-02-04 ENCOUNTER — Encounter: Payer: Self-pay | Admitting: *Deleted

## 2021-02-17 ENCOUNTER — Ambulatory Visit: Payer: Medicaid Other

## 2021-02-20 ENCOUNTER — Ambulatory Visit: Payer: Medicaid Other

## 2021-02-25 ENCOUNTER — Ambulatory Visit: Payer: Medicaid Other

## 2021-02-26 ENCOUNTER — Ambulatory Visit: Payer: Medicaid Other

## 2021-03-03 ENCOUNTER — Ambulatory Visit: Payer: Medicaid Other

## 2021-04-28 ENCOUNTER — Emergency Department (HOSPITAL_BASED_OUTPATIENT_CLINIC_OR_DEPARTMENT_OTHER): Payer: Medicaid Other

## 2021-04-28 ENCOUNTER — Other Ambulatory Visit: Payer: Self-pay

## 2021-04-28 ENCOUNTER — Encounter (HOSPITAL_BASED_OUTPATIENT_CLINIC_OR_DEPARTMENT_OTHER): Payer: Self-pay | Admitting: *Deleted

## 2021-04-28 ENCOUNTER — Ambulatory Visit
Admission: EM | Admit: 2021-04-28 | Discharge: 2021-04-28 | Disposition: A | Discharge status: Home / Self Care | Payer: Medicaid Other

## 2021-04-28 ENCOUNTER — Encounter: Payer: Self-pay | Admitting: Emergency Medicine

## 2021-04-28 ENCOUNTER — Emergency Department (HOSPITAL_BASED_OUTPATIENT_CLINIC_OR_DEPARTMENT_OTHER)
Admission: EM | Admit: 2021-04-28 | Discharge: 2021-04-28 | Disposition: A | Discharge status: Home / Self Care | Payer: Medicaid Other | Attending: Emergency Medicine | Admitting: Emergency Medicine

## 2021-04-28 DIAGNOSIS — R102 Pelvic and perineal pain: Secondary | ICD-10-CM | POA: Diagnosis not present

## 2021-04-28 DIAGNOSIS — Z8616 Personal history of COVID-19: Secondary | ICD-10-CM | POA: Insufficient documentation

## 2021-04-28 DIAGNOSIS — N12 Tubulo-interstitial nephritis, not specified as acute or chronic: Secondary | ICD-10-CM | POA: Diagnosis not present

## 2021-04-28 DIAGNOSIS — R1032 Left lower quadrant pain: Secondary | ICD-10-CM | POA: Diagnosis not present

## 2021-04-28 DIAGNOSIS — R109 Unspecified abdominal pain: Secondary | ICD-10-CM

## 2021-04-28 HISTORY — DX: Calculus of kidney: N20.0

## 2021-04-28 LAB — CBC
HCT: 39.6 % (ref 36.0–46.0)
Hemoglobin: 13.3 g/dL (ref 12.0–15.0)
MCH: 30.1 pg (ref 26.0–34.0)
MCHC: 33.6 g/dL (ref 30.0–36.0)
MCV: 89.6 fL (ref 80.0–100.0)
Platelets: 211 10*3/uL (ref 150–400)
RBC: 4.42 MIL/uL (ref 3.87–5.11)
RDW: 12.6 % (ref 11.5–15.5)
WBC: 10.6 10*3/uL — ABNORMAL HIGH (ref 4.0–10.5)
nRBC: 0 % (ref 0.0–0.2)

## 2021-04-28 LAB — URINALYSIS, ROUTINE W REFLEX MICROSCOPIC
Bilirubin Urine: NEGATIVE
Glucose, UA: NEGATIVE mg/dL
Ketones, ur: 15 mg/dL — AB
Nitrite: NEGATIVE
Protein, ur: 30 mg/dL — AB
Specific Gravity, Urine: 1.023 (ref 1.005–1.030)
pH: 6 (ref 5.0–8.0)

## 2021-04-28 LAB — COMPREHENSIVE METABOLIC PANEL
ALT: 10 U/L (ref 0–44)
AST: 11 U/L — ABNORMAL LOW (ref 15–41)
Albumin: 4 g/dL (ref 3.5–5.0)
Alkaline Phosphatase: 55 U/L (ref 38–126)
Anion gap: 11 (ref 5–15)
BUN: 7 mg/dL (ref 6–20)
CO2: 22 mmol/L (ref 22–32)
Calcium: 9.1 mg/dL (ref 8.9–10.3)
Chloride: 102 mmol/L (ref 98–111)
Creatinine, Ser: 0.61 mg/dL (ref 0.44–1.00)
GFR, Estimated: >60 mL/min (ref >60)
Glucose, Bld: 137 mg/dL — ABNORMAL HIGH (ref 70–99)
Potassium: 3.3 mmol/L — ABNORMAL LOW (ref 3.5–5.1)
Sodium: 135 mmol/L (ref 135–145)
Total Bilirubin: 0.6 mg/dL (ref 0.3–1.2)
Total Protein: 7.4 g/dL (ref 6.5–8.1)

## 2021-04-28 LAB — WET PREP, GENITAL
Clue Cells Wet Prep HPF POC: NONE SEEN
Sperm: NONE SEEN
Trich, Wet Prep: NONE SEEN
Yeast Wet Prep HPF POC: NONE SEEN

## 2021-04-28 LAB — LIPASE, BLOOD: Lipase: 18 U/L (ref 11–51)

## 2021-04-28 LAB — PREGNANCY, URINE: Preg Test, Ur: NEGATIVE

## 2021-04-28 MED ORDER — ONDANSETRON HCL 4 MG/2ML IJ SOLN
4.0000 mg | Freq: Once | INTRAMUSCULAR | Status: AC
  Administered 2021-04-28: 4 mg via INTRAVENOUS
  Filled 2021-04-28: qty 2

## 2021-04-28 MED ORDER — ACETAMINOPHEN 500 MG PO TABS
1000.0000 mg | ORAL_TABLET | Freq: Once | ORAL | Status: AC
  Administered 2021-04-28: 1000 mg via ORAL
  Filled 2021-04-28: qty 2

## 2021-04-28 MED ORDER — IOHEXOL 350 MG/ML SOLN
75.0000 mL | Freq: Once | INTRAVENOUS | Status: AC | PRN
  Administered 2021-04-28: 75 mL via INTRAVENOUS

## 2021-04-28 MED ORDER — ONDANSETRON HCL 4 MG PO TABS: 4.0000 mg | ORAL_TABLET | Freq: Three times a day (TID) | ORAL | 0 refills | Status: AC | PRN

## 2021-04-28 MED ORDER — CEPHALEXIN 500 MG PO CAPS: 500.0000 mg | ORAL_CAPSULE | Freq: Two times a day (BID) | ORAL | 0 refills | Status: AC

## 2021-04-28 MED ORDER — SODIUM CHLORIDE 0.9 % IV BOLUS
1000.0000 mL | Freq: Once | INTRAVENOUS | Status: AC
  Administered 2021-04-28: 1000 mL via INTRAVENOUS

## 2021-04-28 MED ORDER — OXYCODONE-ACETAMINOPHEN 5-325 MG PO TABS: 1.0000 | ORAL_TABLET | ORAL | 0 refills | Status: AC | PRN

## 2021-04-28 MED ORDER — MORPHINE SULFATE (PF) 4 MG/ML IV SOLN
4.0000 mg | Freq: Once | INTRAVENOUS | Status: AC
  Administered 2021-04-28: 4 mg via INTRAVENOUS
  Filled 2021-04-28: qty 1

## 2021-04-28 NOTE — ED Notes (Signed)
Patient provided with sprite and teddy grahams for PO challenge.

## 2021-04-28 NOTE — ED Provider Notes (Signed)
[facility]   CSN: [id number] Arrival date & time: 04/28/21  1417     History Chief Complaint  Patient presents with   Abdominal Pain    Leah Martin is a 25 y.o. female.  The history is provided by the patient. No language interpreter was used.  Abdominal Pain Pain location:  LLQ and LUQ Pain quality: aching and sharp   Pain radiates to:  Groin and L flank Pain severity:  Severe Onset quality:  Gradual Duration:  5 days Timing:  Constant Progression:  Unchanged Chronicity:  New Context comment:  Recent deliver on 3/20 Relieved by:  Nothing Worsened by:  Palpation and position changes Ineffective treatments:  None tried Associated symptoms: fever, nausea, vaginal bleeding (stopped on thursday) and vomiting   Associated symptoms: no chest pain, no chills, no constipation, no cough, no diarrhea, no dysuria, no fatigue, no shortness of breath and no vaginal discharge   Risk factors: not pregnant       Past Medical History:  Diagnosis Date   Indication for care in labor or delivery 11/30/2020   Medical history non-contributory    Supervision of other normal pregnancy, antepartum 04/30/2020    Nursing Staff Provider Office Location  [facility] Dating  Ultrasound Language  English Anatomy US  Normal Female Flu Vaccine  07/25/20 Genetic Screen  NIPS: declined  AFP: declined  First Screen:  Quad:   TDaP vaccine    Hgb A1C or  GTT Early  Third trimester Normal   Glucose, Fasting 65 - 91 mg/dL 74  Glucose, 1 hour 65 - 179 mg/dL 92  Glucose, 2 hour 65 - 152 mg/dL 409131   COVID vaccine    LAB    Vaginal delivery 12/01/2020    Patient Active Problem List   Diagnosis Date Noted   COVID-19 affecting pregnancy in third trimester 10/01/2020   LGSIL on Pap smear of cervix 05/17/2020   Fever of unknown origin 08/09/2019    Past Surgical History:  Procedure Laterality Date   DILATION AND CURETTAGE OF UTERUS N/A 08/09/2019    Procedure: SUCTION DILATATION AND CURETTAGE;  Surgeon: Conan Bowensavis, Kelly M, MD;  [facility];  Service: Gynecology;  Laterality: N/A;   NO PAST SURGERIES       OB History     Gravida  2   Para  1   Term  1   Preterm      AB  1   Living  1      SAB  1   IAB      Ectopic      Multiple  0   Live Births  1           Family History  Problem Relation Age of Onset   Kidney disease Mother     Social History   Tobacco Use   Smoking status: Never   Smokeless tobacco: Never  Vaping Use   Vaping Use: Never used  Substance Use Topics   Alcohol use: No   Drug use: No    Home Medications Prior to Admission medications   Medication Sig Start Date End Date Taking? Authorizing Provider  medroxyPROGESTERone (DEPO-PROVERA) 150 MG/ML injection Inject 1 mL (150 mg total) into the muscle every 3 (three) months. 01/15/21   Rasch, Harolyn RutherfordJennifer I, NP    Allergies    Patient has no known allergies.  Review of Systems   Review of Systems  Constitutional:  Positive for fever. Negative for  chills, diaphoresis and fatigue.  HENT:  Negative for congestion.   Eyes:  Negative for visual disturbance.  Respiratory:  Negative for cough, chest tightness and shortness of breath.   Cardiovascular:  Negative for chest pain.  Gastrointestinal:  Positive for abdominal pain, nausea and vomiting. Negative for abdominal distention, constipation and diarrhea.  Genitourinary:  Positive for flank pain, pelvic pain and vaginal bleeding (stopped on thursday). Negative for difficulty urinating, dysuria, enuresis, frequency and vaginal discharge.  Musculoskeletal:  Positive for back pain. Negative for neck pain and neck stiffness.  Skin:  Negative for rash and wound.  Neurological:  Negative for light-headedness and headaches.  Psychiatric/Behavioral:  Negative for agitation and confusion.   All other systems reviewed and are negative.  Physical Exam Updated Vital Signs BP 126/76 (BP Location:  Right Arm)   Pulse (!) 106   Temp (!) 102 F (38.9 C) (Oral)   Resp 18   Ht 5\' 2"  (1.575 m)   Wt 65.3 kg   SpO2 100%   BMI 26.34 kg/m   Physical Exam Vitals and nursing note reviewed. Exam conducted with a chaperone present.  Constitutional:      General: She is not in acute distress.    Appearance: She is well-developed. She is not ill-appearing, toxic-appearing or diaphoretic.  HENT:     Head: Normocephalic and atraumatic.  Eyes:     Conjunctiva/sclera: Conjunctivae normal.  Cardiovascular:     Rate and Rhythm: Regular rhythm. Tachycardia present.     Heart sounds: No murmur heard. Pulmonary:     Effort: Pulmonary effort is normal. No respiratory distress.     Breath sounds: Normal breath sounds. No wheezing, rhonchi or rales.  Chest:     Chest wall: No tenderness.  Abdominal:     General: Abdomen is flat.     Palpations: Abdomen is soft.     Tenderness: There is abdominal tenderness in the suprapubic area, left upper quadrant and left lower quadrant. There is no left CVA tenderness, guarding or rebound.  Genitourinary:    Vagina: Vaginal discharge present.     Cervix: Cervical motion tenderness and discharge present.     Adnexa:        Right: No tenderness.         Left: Tenderness present.   Musculoskeletal:     Cervical back: Neck supple.  Skin:    General: Skin is warm and dry.     Capillary Refill: Capillary refill takes less than 2 seconds.     Coloration: Skin is not pale.  Neurological:     General: No focal deficit present.     Mental Status: She is alert.  Psychiatric:        Mood and Affect: Mood normal.    ED Results / Procedures / Treatments   Labs (all labs ordered are listed, but only abnormal results are displayed) Labs Reviewed  COMPREHENSIVE METABOLIC PANEL - Abnormal; Notable for the following components:      Result Value   Potassium 3.3 (*)    Glucose, Bld 137 (*)    AST 11 (*)    All other components within normal limits  CBC -  Abnormal; Notable for the following components:   WBC 10.6 (*)    All other components within normal limits  URINALYSIS, ROUTINE W REFLEX MICROSCOPIC - Abnormal; Notable for the following components:   APPearance CLOUDY (*)    Hgb urine dipstick MODERATE (*)    Ketones, ur 15 (*)  Protein, ur 30 (*)    Leukocytes,Ua LARGE (*)    Bacteria, UA MANY (*)    All other components within normal limits  LIPASE, BLOOD  PREGNANCY, URINE    EKG None  Radiology No results found.  Procedures Procedures   Medications Ordered in ED Medications  sodium chloride 0.9 % bolus 1,000 mL (1,000 mLs Intravenous New Bag/Given 04/28/21 1738)  acetaminophen (TYLENOL) tablet 1,000 mg (1,000 mg Oral Given 04/28/21 1737)    ED Course  I have reviewed the triage vital signs and the nursing notes.  Pertinent labs & imaging results that were available during my care of the patient were reviewed by me and considered in my medical decision making (see chart for details).    MDM Rules/Calculators/A&P                           LATRISE BOWLAND is a 25 y.o. female with a past medical history significant for prior kidney stone, previous vaginal delivery on 12/01/2020 who presents with left lower abdominal and pelvic pain for the last 5 days.  According to patient, she has had vaginal bleeding ever since her delivery several months ago.  She reports that on Thursday, 5 days ago, the bleeding stopped.  About this time, she started having pain in her left pelvic area and left abdomen.  She reports the pain is up to an 8 out of 10 in severity and is in the left lower quadrant and left periumbilical area.  Also around the left flank but that has since improved.  She reports nausea and vomiting that has improved.  She reports no constipation or diarrhea.  She reports this does not feel like when she had kidney stone in the past.  She denies any known vaginal discharge that she has seen and denies any trauma.  She  reports she is not having vaginal bleeding currently.  She denies any cough, congestion, or URI symptoms.  On arrival she was found to be febrile and tachycardic.  On exam, lungs are clear and chest is nontender.  Abdomen is tender in the left lower quadrant and left hemiabdomen.  No rash to suggest shingles seen.  A pelvic exam was performed with a chaperone and she did have left sided adnexal tenderness and CVA tenderness.  She did have some discharge seen on the cervix.  There was some mild cervical motion tenderness but not extremely severe.  Her left flank was nontender and exam was otherwise unremarkable.  Clinically I am concerned that she may have a gynecological cause of her discomfort.  We will get a pelvic ultrasound and get the swabs from the pelvic exam collected.  We will get screening labs and give her some pain medicine and nausea medicine and fluids.  We will make her n.p.o.  Considerations with the discharge include TOA, PID, or even a UTI/pyelonephritis or kidney stone.  If ultrasound is unrevealing and she is still having pain, would consider a CT scan to rule out diverticulitis versus kidney stone or other.  Anticipate reassessment after work-up  9:58 PM Patient's work-up returned and the ultrasound was reassuring without evidence of torsion.  Some free fluid was seen.  CT scan was thus performed and pyelonephritis was seen.  Urinalysis also shows concern for infection she does have pyelonephritis causing pain in her left abdomen and left flank.  No evidence of infected stone.  After medications, she was feeling much better.  She does not appear to be septic at this time.  Fever was treated successfully.  We had a shared decision-making conversation about work-up has given her vital signs on arrival, we could consider IV antibiotics admission however as she is feeling much better, she would like to go home with oral antibiotics.  Patient was able to eat and drink without difficulty  and we feel safe for discharge home.  Patient understands return precautions and was discharged in good condition.    Final Clinical Impression(s) / ED Diagnoses Final diagnoses:  Pyelonephritis  Left sided abdominal pain    Rx / DC Orders ED Discharge Orders          Ordered    cephALEXin (KEFLEX) 500 MG capsule  2 times daily        04/28/21 2157    oxyCODONE-acetaminophen (PERCOCET/ROXICET) 5-325 MG tablet  Every 4 hours PRN        04/28/21 2157    ondansetron (ZOFRAN) 4 MG tablet  Every 8 hours PRN        04/28/21 2157           Clinical Impression: 1. Pyelonephritis   2. Pelvic pain   3. Left sided abdominal pain     Disposition: Discharge  Condition: Good  I have discussed the results, Dx and Tx plan with the pt(& family if present). He/she/they expressed understanding and agree(s) with the plan. Discharge instructions discussed at great length. Strict return precautions discussed and pt &/or family have verbalized understanding of the instructions. No further questions at time of discharge.    New Prescriptions   CEPHALEXIN (KEFLEX) 500 MG CAPSULE    Take 1 capsule (500 mg total) by mouth 2 (two) times daily for 14 days.   ONDANSETRON (ZOFRAN) 4 MG TABLET    Take 1 tablet (4 mg total) by mouth every 8 (eight) hours as needed for nausea or vomiting.   OXYCODONE-ACETAMINOPHEN (PERCOCET/ROXICET) 5-325 MG TABLET    Take 1 tablet by mouth every 4 (four) hours as needed for severe pain.    Follow Up: [facility] [address] [place] [address] [phone] Schedule an appointment as soon as possible for a visit    [facility] [address] [place] [address] [phone]       Jerika Wales, Canary Brim, MD 04/28/21 2159

## 2021-04-28 NOTE — ED Triage Notes (Signed)
Lower abdominal/pelvic pain starting Thursday. States she had her baby in march and has been scantly bleeding since. Thursday she stopped bleeding, but began to have abdominal pressure/pain that has increased into today. More left lower quadrant and into back but says it's present throughout lower abdomin, sharp in nature, heat helps to relieve. Patient describes having had a possible kidney stone in July. Fever in triage of 100.2

## 2021-04-28 NOTE — ED Provider Notes (Signed)
[facility]    CSN: [id number] Arrival date & time: 04/28/21  1230      History   Chief Complaint Chief Complaint  Patient presents with   Abdominal Pain    HPI Leah Martin is a 25 y.o. female.   Patient presents with lower abdominal,pelvic pain predominantly in the left lower quadrant for 4 days progressively becoming worse in intensity, rating 10/10. Described as pressure and sharp. Began vomiting this morning. Endorses poor appetite tolerating liquids. Denies fever, chills, body aches, constipation or diarrhea, URI symptoms, recent travel, changes in diets. LBM 8/14. Had baby in March without complication. On Depo, last injection 5/24, irregular bleeding, bleeding stopped 4 days ago, next injection due in September. Thinks she passed kidney stone in July, was having abdominal pain but felt different than this. Denies history of ovarian cyst or fibroid. Sexually active.   Past Medical History:  Diagnosis Date   Indication for care in labor or delivery 11/30/2020   Medical history non-contributory    Supervision of other normal pregnancy, antepartum 04/30/2020    Nursing Staff Provider Office Location  [facility] Dating  Ultrasound Language  English Anatomy US  Normal Female Flu Vaccine  07/25/20 Genetic Screen  NIPS: declined  AFP: declined  First Screen:  Quad:   TDaP vaccine    Hgb A1C or  GTT Early  Third trimester Normal   Glucose, Fasting 65 - 91 mg/dL 74  Glucose, 1 hour 65 - 179 mg/dL 92  Glucose, 2 hour 65 - 152 mg/dL 370   COVID vaccine    LAB    Vaginal delivery 12/01/2020    Patient Active Problem List   Diagnosis Date Noted   COVID-19 affecting pregnancy in third trimester 10/01/2020   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 Authorizing Provider  medroxyPROGESTERone (DEPO-PROVERA) 150 MG/ML injection Inject 1 mL (150 mg total) into the muscle every 3 (three) months. 01/15/21   Rasch, Harolyn Rutherford, NP    Family History Family History  Problem Relation Age of Onset   Kidney disease Mother     Social History Social History   Tobacco Use   Smoking status: Never   Smokeless tobacco: Never  Vaping Use   Vaping Use: Never used  Substance Use Topics   Alcohol use: No   Drug use: No     Allergies   Patient has no known allergies.   Review of Systems Review of Systems   Physical Exam Triage Vital Signs ED Triage Vitals [04/28/21 1308]  Enc Vitals Group     BP 121/72  Pulse Rate (!) 112     Resp 16     Temp 100.2 F (37.9 C)     Temp src      SpO2 98 %     Weight      Height      Head Circumference      Peak Flow      Pain Score 10     Pain Loc      Pain Edu?      Excl. in GC?    No data found.  Updated Vital Signs BP 121/72 (BP Location: Left Arm)   Pulse (!) 112   Temp 100.2 F (37.9 C)   Resp 16   SpO2 98%   Visual Acuity Right Eye Distance:   Left Eye Distance:   Bilateral Distance:    Right Eye Near:   Left Eye Near:    Bilateral Near:     Physical Exam   UC Treatments / Results  Labs (all labs ordered are listed, but only abnormal results are displayed) Labs Reviewed - No data to display  EKG   Radiology No results found.  Procedures Procedures (including critical care time)  Medications Ordered in UC Medications - No data to display  Initial Impression / Assessment and Plan / UC Course   I have reviewed the triage vital signs and the nursing notes.  Pertinent labs & imaging results that were available during my care of the patient were reviewed by me and considered in my medical decision making (see chart for details).  Left lower quadrant abdominal pain  Patient sent to emergency department for evaluation and imaging for abdominal pain with associated vomiting, fever and tachycardia present in triage, severe LLQ abdominal tenderness on  exam with tenderness described as pressure in upper left quadrant. No CVA tenderness. Escorted by significant other to E. In agreement with plan of care  Final Clinical Impressions(s) / UC Diagnoses   Final diagnoses:  Abdominal pain, left lower quadrant     Discharge Instructions      Please go to nearest emergency department now for imaging and evaluation of abdominal pain with fever and fast heart rate,   ED Prescriptions   None    PDMP not reviewed this encounter.   Valinda Hoar, NP 04/28/21 1330

## 2021-04-28 NOTE — Discharge Instructions (Addendum)
Please go to nearest emergency department now for imaging and evaluation of abdominal pain with fever and fast heart rate,

## 2021-04-28 NOTE — ED Notes (Addendum)
Patient reports lower abdominal pain radiating around to left flank since Thursday with some nausea and vomiting, fever last night and this morning.  Patient has been having intermittent vaginal bleeding since giving birth in March, she states sometimes just a little, sometimes "a lot"  Patient states she has told her OB/GYN and has an appointment in September.  Patient also reports passing a kidney stone in June.

## 2021-04-28 NOTE — ED Notes (Addendum)
Patient is being discharged from the Urgent Care and sent to the Emergency Department via POV . Per A. White NP, patient is in need of higher level of care due to abdominal pain, fever. Patient is aware and verbalizes understanding of plan of care.  Vitals:   04/28/21 1308  BP: 121/72  Pulse: (!) 112  Resp: 16  Temp: 100.2 F (37.9 C)  SpO2: 98%

## 2021-04-28 NOTE — ED Triage Notes (Signed)
Pt continues to have abd pain with bad cramping, pain in lower back as well, Thursday was the first day without any bleeding since giving birth.

## 2021-04-28 NOTE — Discharge Instructions (Addendum)
Your history, exam, work-up today are consistent with a pyelonephritis or urinary tract infection that has gone to your kidney.  This is causing the pain on the left side of your abdomen.  The other work-up was overall reassuring.  Some of the swabs are still in process and if they are positive, you will be called.  Please use the antibiotics, pain medicine, and nausea medicine help with your symptoms and please rest and stay hydrated.  If any symptoms change or worsen, please return to the nearest emergency department.

## 2021-04-29 LAB — GC/CHLAMYDIA PROBE AMP (~~LOC~~) NOT AT ARMC
Chlamydia: NEGATIVE
Comment: NEGATIVE
Comment: NORMAL
Neisseria Gonorrhea: NEGATIVE

## 2021-05-01 LAB — URINE CULTURE: Culture: >=100000 — AB

## 2021-05-02 NOTE — Progress Notes (Signed)
ED Antimicrobial Stewardship Positive Culture Follow Up   Leah Martin is an 25 y.o. female who presented to [facility] on 04/28/2021 with a chief complaint of  Chief Complaint  Patient presents with   Abdominal Pain    Recent Results (from the past 720 hour(s))  Urine Culture     Status: Abnormal   Collection Time: 04/28/21  2:54 PM   Specimen: Urine, Clean Catch  Result Value Ref Range Status   Specimen Description   Final    URINE, CLEAN CATCH Performed at [facility], [address] [place], [place] [address]    Special Requests   Final    NONE Performed at [facility], [address] [place], [place] [address]    Culture (A)  Final    >=100,000 COLONIES/mL ESCHERICHIA COLI Confirmed Extended Spectrum Beta-Lactamase Producer (ESBL).  In bloodstream infections from ESBL organisms, carbapenems are preferred over piperacillin/tazobactam. They are shown to have a lower risk of mortality.    Report Status 05/01/2021 FINAL  Final   Organism ID, Bacteria ESCHERICHIA COLI (A)  Final      Susceptibility   Escherichia coli - MIC*    AMPICILLIN >=32 RESISTANT Resistant     CEFAZOLIN >=64 RESISTANT Resistant     CEFEPIME 16 RESISTANT Resistant     CEFTRIAXONE >=64 RESISTANT Resistant     CIPROFLOXACIN >=4 RESISTANT Resistant     GENTAMICIN <=1 SENSITIVE Sensitive     IMIPENEM <=0.25 SENSITIVE Sensitive     NITROFURANTOIN <=16 SENSITIVE Sensitive     TRIMETH/SULFA <=20 SENSITIVE Sensitive     AMPICILLIN/SULBACTAM >=32 RESISTANT Resistant     PIP/TAZO <=4 SENSITIVE Sensitive     * >=100,000 COLONIES/mL ESCHERICHIA COLI  Wet prep, genital     Status: Abnormal   Collection Time: 04/28/21  6:54 PM   Specimen: PATH Cytology Cervicovaginal Ancillary Only  Result Value Ref Range Status   Yeast Wet Prep HPF POC NONE SEEN NONE SEEN Final   Trich, Wet Prep NONE SEEN NONE SEEN Final   Clue Cells Wet Prep HPF POC NONE SEEN NONE SEEN Final    WBC, Wet Prep HPF POC FEW (A) NONE SEEN Final   Sperm NONE SEEN  Final    Comment: Performed at [facility], [address], [place], [place] [address]    [x]  Treated with cephalexin, organism resistant to prescribed antimicrobial  New antibiotic prescription: 1 Bactrim DS PO x7 days  ED Provider: , MD   [name] 05/02/2021, 9:32 AM Clinical Pharmacist Monday - Friday phone -  [phone] Saturday - Sunday phone - [phone]

## 2021-05-03 ENCOUNTER — Telehealth: Payer: Self-pay | Admitting: Emergency Medicine

## 2021-05-03 NOTE — Telephone Encounter (Signed)
Post ED Visit - Positive Culture Follow-up: Successful Patient Follow-Up  Culture assessed and recommendations reviewed by:  []  , Pharm.D. []  Enzo Bi, Pharm.D., BCPS AQ-ID []  , Pharm.D., BCPS []  Celedonio Miyamoto, Pharm.D., BCPS []  [place], Garvin Fila.D., BCPS, AAHIVP []  , Pharm.D., BCPS, AAHIVP []  Georgina Pillion, PharmD, BCPS []  , PharmD, BCPS []  [place], PharmD, BCPS [x]  [address], PharmD  Positive urine culture  []  Patient discharged without antimicrobial prescription and treatment is now indicated [x]  Organism is resistant to prescribed ED discharge antimicrobial []  Patient with positive blood cultures  Changes discussed with ED provider: , MD New antibiotic prescription Bactrim DS one tab PO BID for seven days Called to [facility] [phone] [facility])  Contacted patient, date 05/03/21, time 1430   Shavona Gunderman C Praise Dolecki 05/03/2021, 3:07 PM

## 2022-02-26 ENCOUNTER — Telehealth: Payer: Self-pay | Admitting: Lactation Services

## 2022-02-26 NOTE — Telephone Encounter (Signed)
Received refill request from pharmacy to refill Depo prescription. Patient previously seen a [facility]. Has been seen by Norva Riffle for last Depo injection.   Called Patient to verify if she needs as appointment or will follow up with Norva Riffle for next dosage. Patient did not answer and received message that call could not be completed at this time.   Will send Mychart message to patient.

## 2022-04-12 IMAGING — CT CT ABD-PELV W/ CM
2 of 4 series · 16 of 46 positions shown, 18 images · IV contrast (APPLIED)
Comparison: Pelvic ultrasound earlier today. Noncontrast CT
10/26/2017

CLINICAL DATA: Abdominal pain and fever. Left lower quadrant pain.
Pelvic ultrasound reassuring.

EXAM:
CT ABDOMEN AND PELVIS WITH CONTRAST
TECHNIQUE: Multidetector CT imaging of the abdomen and pelvis was performed
using the standard protocol following bolus administration of
intravenous contrast.
CONTRAST:  75mL OMNIPAQUE IOHEXOL 350 MG/ML SOLN

[Series 2: abd pel w · axial · 0.69mm/px · z∈[+582,+1022]mm · 13 of 98 slices shown, 15 images]
[im 5/98  soft-tissue]
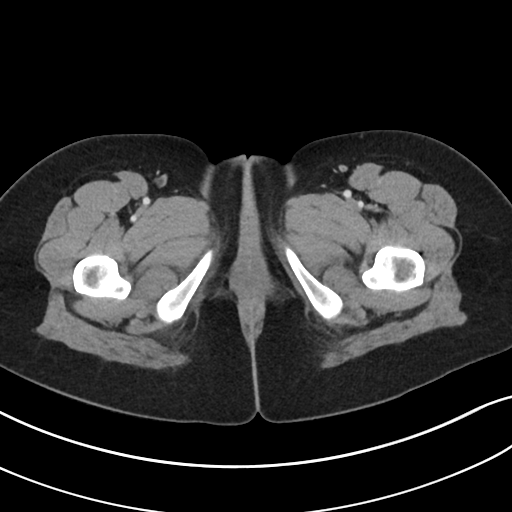
[im 5/98  bone]
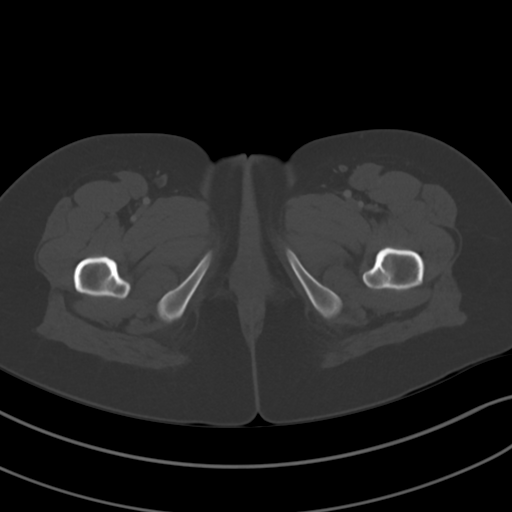
[im 13/98  soft-tissue]
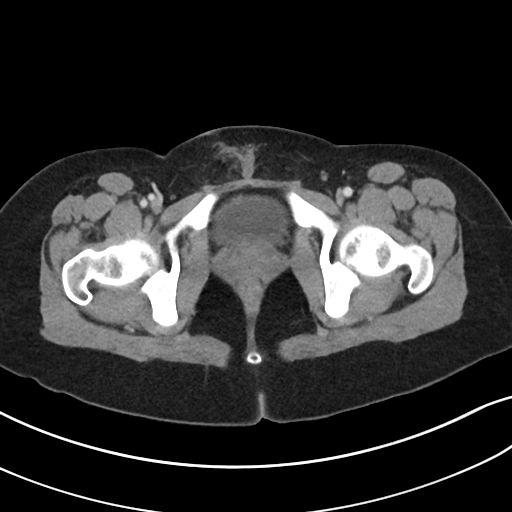
[im 21/98  soft-tissue]
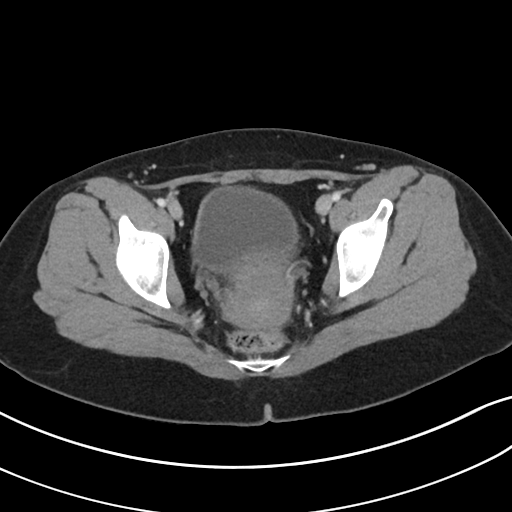
[im 29/98  soft-tissue]
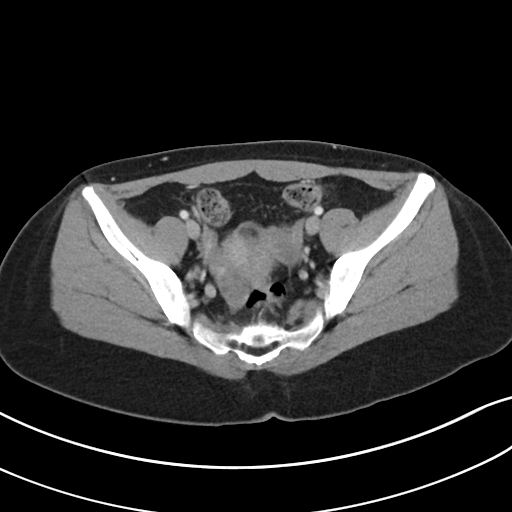
[im 33/98  soft-tissue]
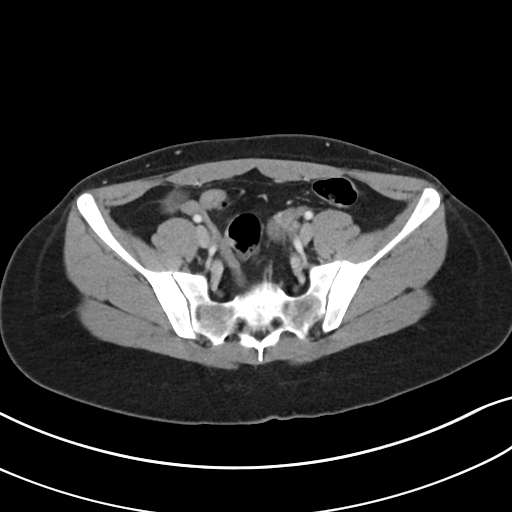
[im 41/98  soft-tissue]
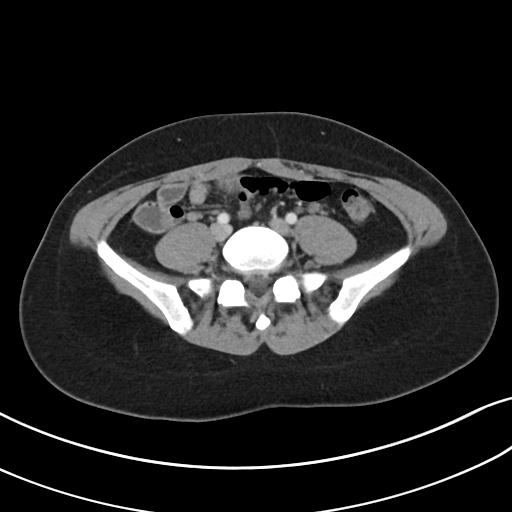
[im 49/98  soft-tissue]
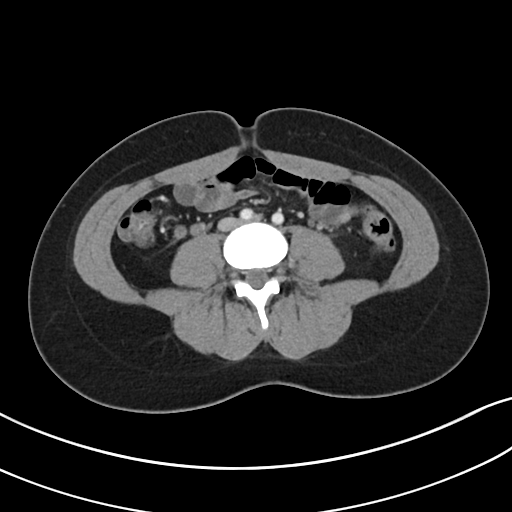
[im 57/98  soft-tissue]
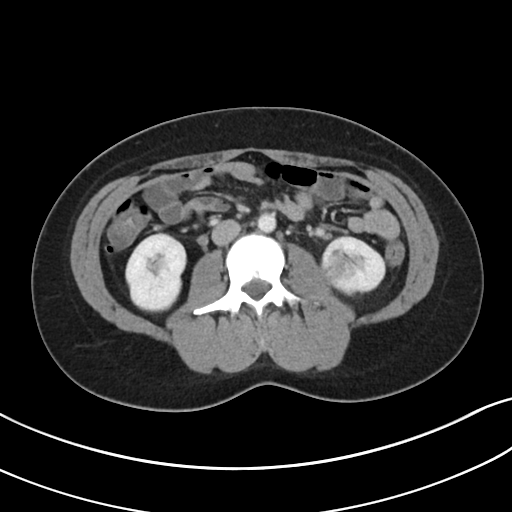
[im 65/98  soft-tissue]
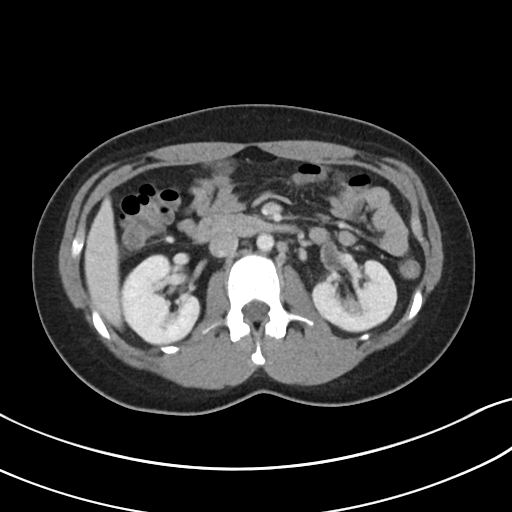
[im 65/98  bone]
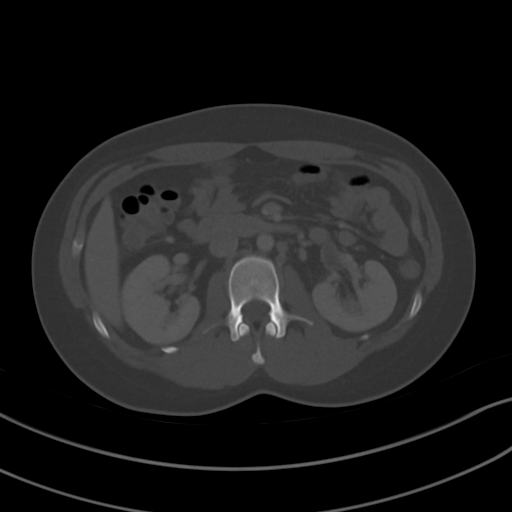
[im 69/98  soft-tissue]
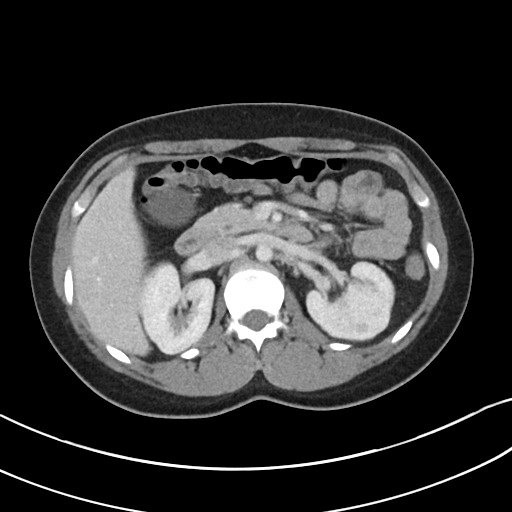
[im 77/98  soft-tissue]
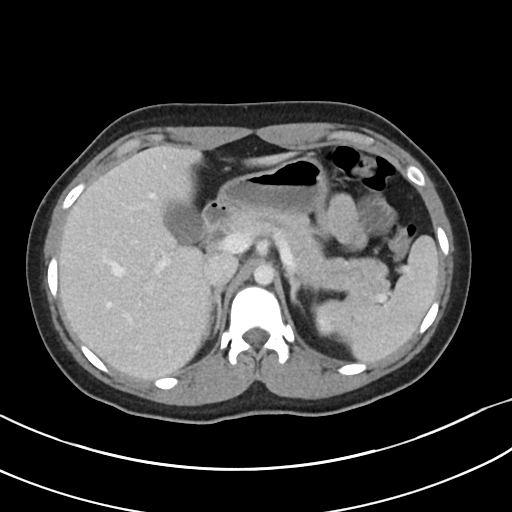
[im 85/98  soft-tissue]
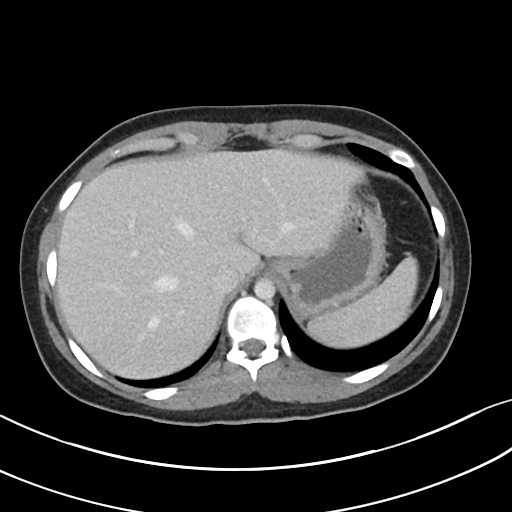
[im 93/98  soft-tissue]
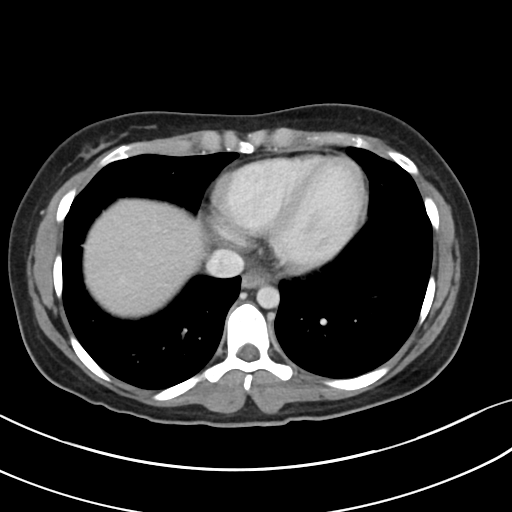

[Series 5: coronal · coronal · 0.73mm/px · 3 of 76 slices shown]
[im 26/76  soft-tissue]
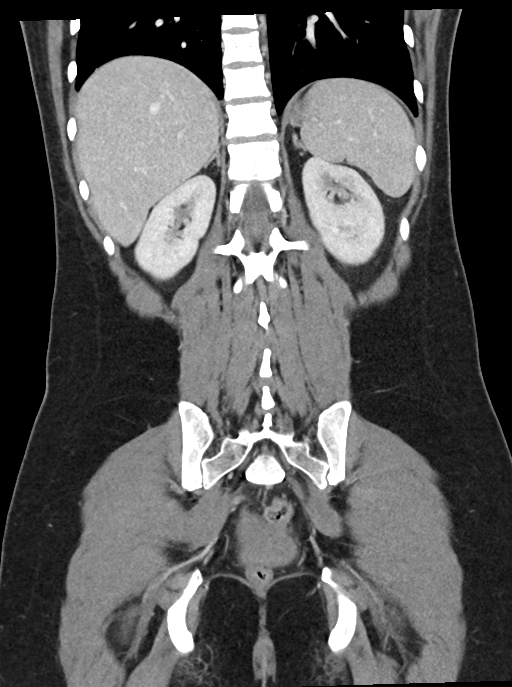
[im 34/76  soft-tissue]
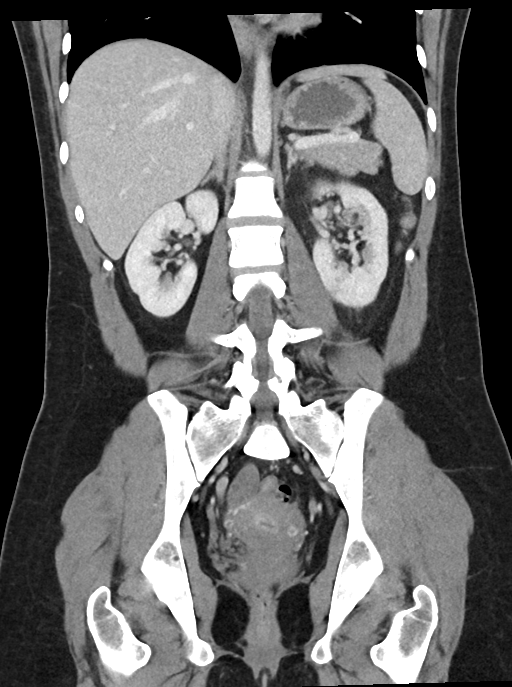
[im 42/76  soft-tissue]
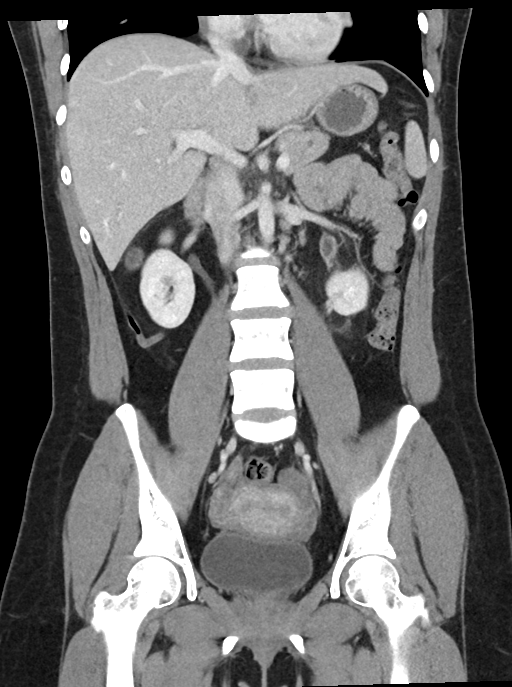

[16 of 46 positions shown; findings below may reference images not displayed]

FINDINGS: Lower chest: The lung bases are clear. No focal airspace disease or
pleural effusion.

Hepatobiliary: No focal liver abnormality is seen. No gallstones,
gallbladder wall thickening, or biliary dilatation.

Pancreas: Unremarkable. No pancreatic ductal dilatation or
surrounding inflammatory changes.

Spleen: Normal in size without focal abnormality.

Adrenals/Urinary Tract: Normal adrenal glands. There is mild
heterogeneous left renal enhancement, particularly involving the
lower pole, coronal reformat series 5, image 37. Mild enhancement of
the left renal pelvis and ureteral thickening. No visualized
urolithiasis. No perirenal or intrarenal fluid collection. There is
faint left perinephric edema. Homogeneous enhancement of the right
kidney. No definite urinary bladder wall thickening or perivesicular
fat stranding.

Stomach/Bowel: Stomach is within normal limits. Appendix appears
normal. No evidence of bowel wall thickening, distention, or
inflammatory changes.

Vascular/Lymphatic: Normal caliber abdominal aorta. Patent portal
vein. Occasional small retroperitoneal lymph nodes which are likely
reactive in the setting. No bulky abdominopelvic adenopathy.

Reproductive: Uterus and bilateral adnexa are unremarkable.

Other: No free air, free fluid, or intra-abdominal fluid collection.
Tiny fat containing umbilical hernia.

Musculoskeletal: Bone island in the right iliac bone, stable from
prior. There are no acute or suspicious osseous abnormalities.
IMPRESSION: Findings consistent with left pyelonephritis. No renal abscess.

## 2022-04-12 IMAGING — US US PELVIS COMPLETE TRANSABD/TRANSVAG W DUPLEX
1 series · 13 of 25 positions shown · non-contrast
Comparison: None.

CLINICAL DATA: Left pelvic pain with fever

EXAM:
TRANSABDOMINAL AND TRANSVAGINAL ULTRASOUND OF PELVIS
DOPPLER ULTRASOUND OF OVARIES
TECHNIQUE: Both transabdominal and transvaginal ultrasound examinations of the
pelvis were performed. Transabdominal technique was performed for
global imaging of the pelvis including uterus, ovaries, adnexal
regions, and pelvic cul-de-sac.
It was necessary to proceed with endovaginal exam following the
transabdominal exam to visualize the uterus endometrium ovaries.
Color and duplex Doppler ultrasound was utilized to evaluate blood
flow to the ovaries.

[Series 1: us pelvis (transabdominal only) · 13 of 64 slices shown]
[im 1/64]
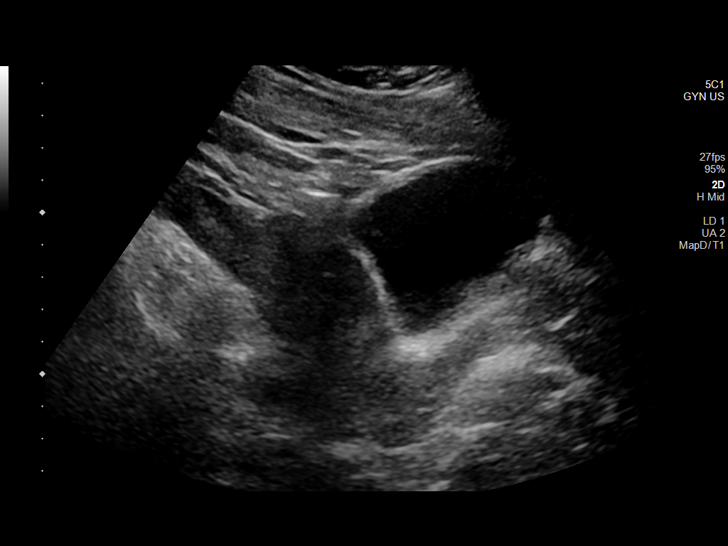
[im 6/64]
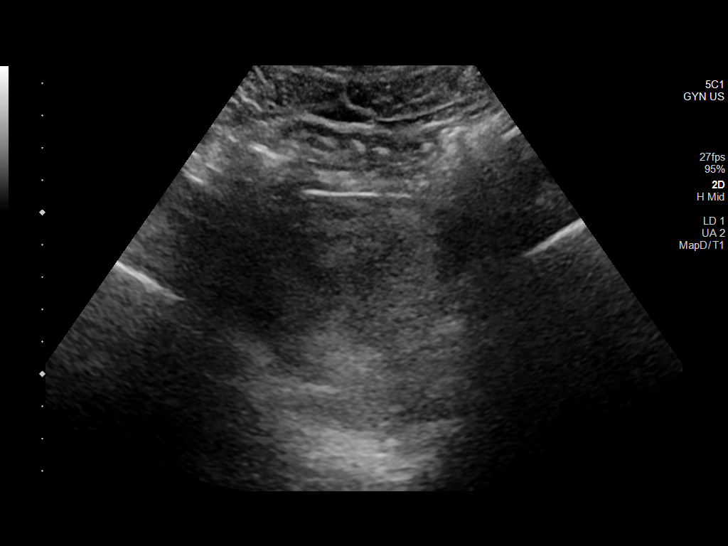
[im 11/64]
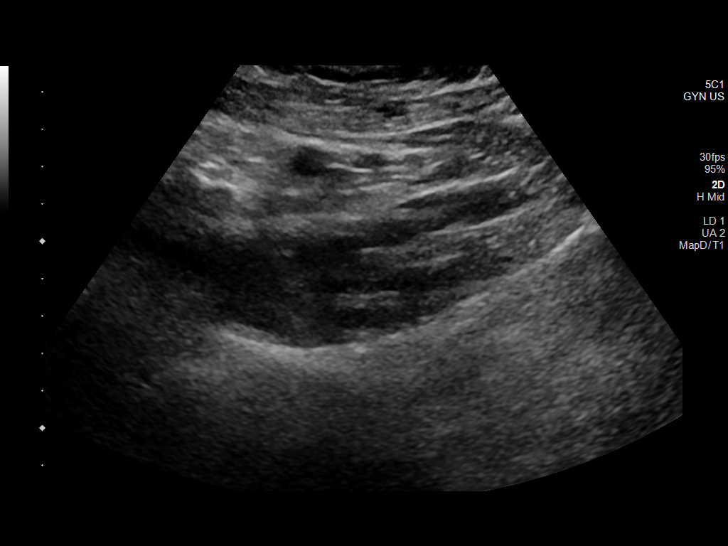
[im 16/64]
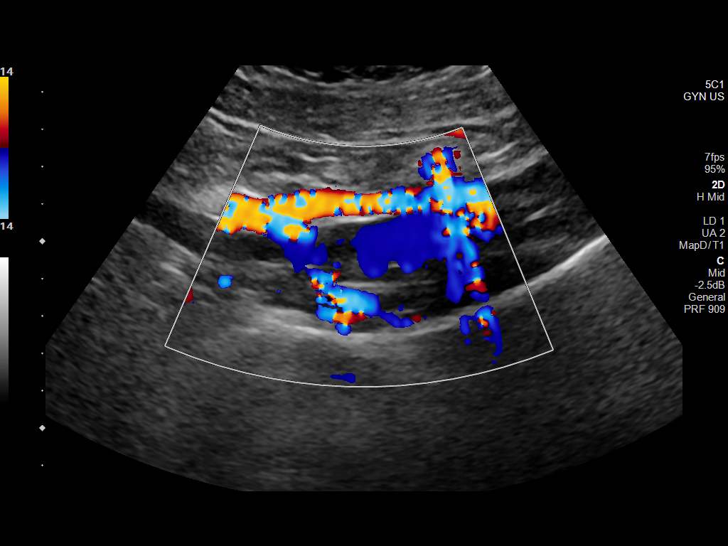
[im 22/64]
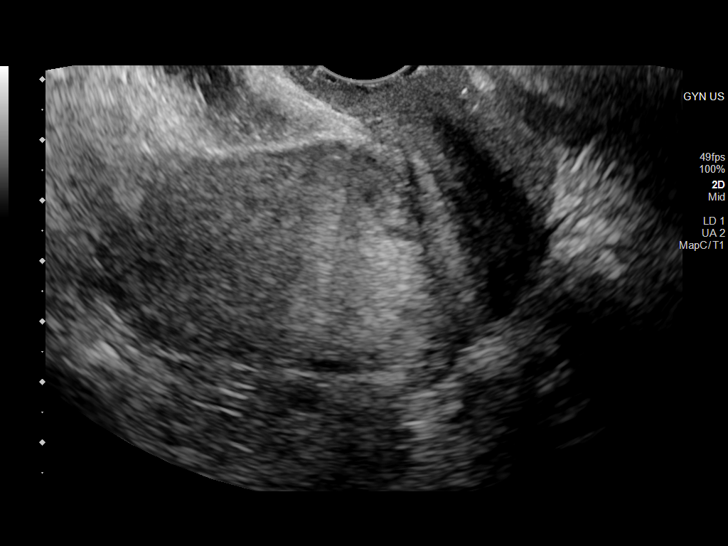
[im 27/64]
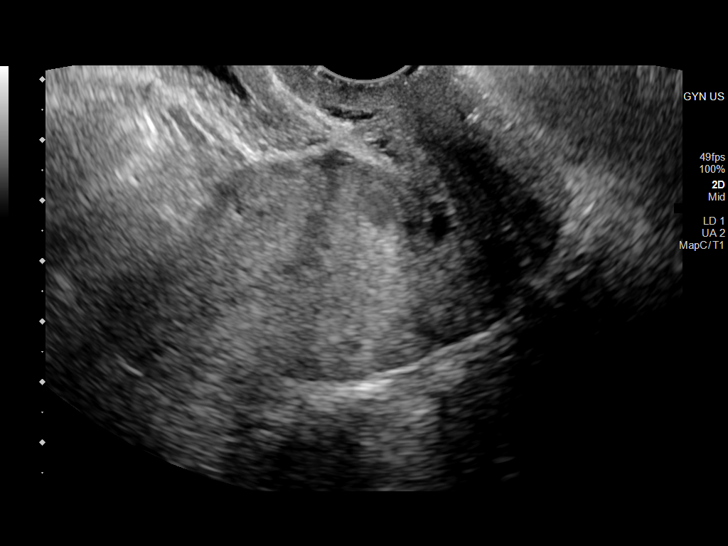
[im 32/64]
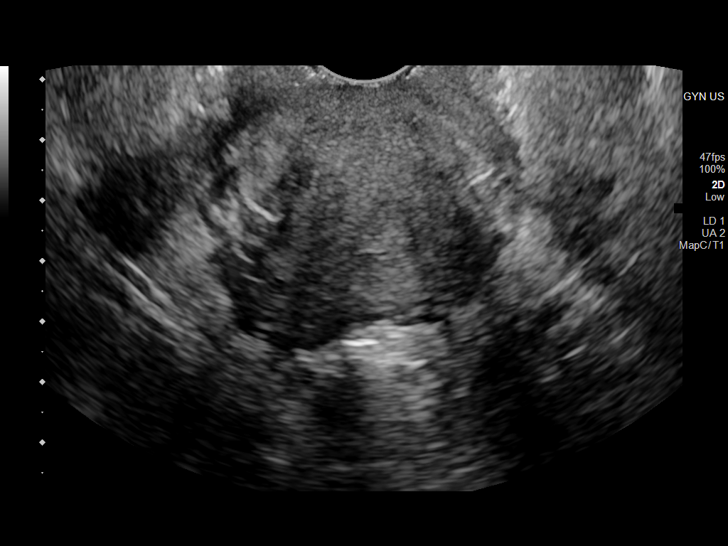
[im 37/64]
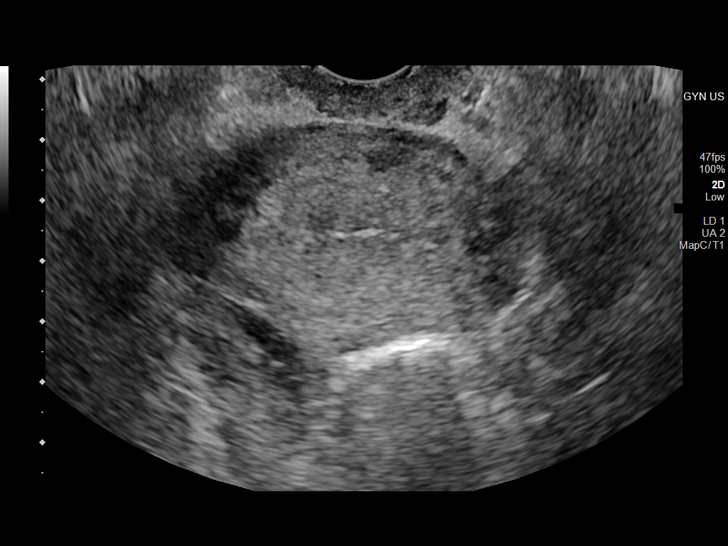
[im 43/64]
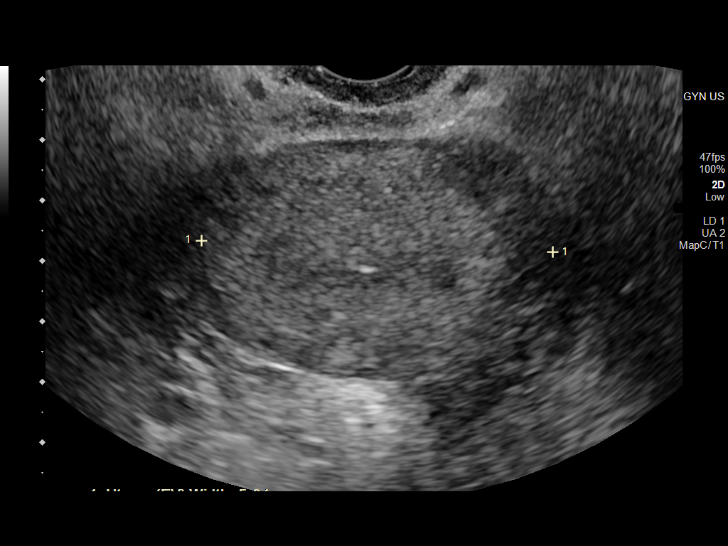
[im 48/64]
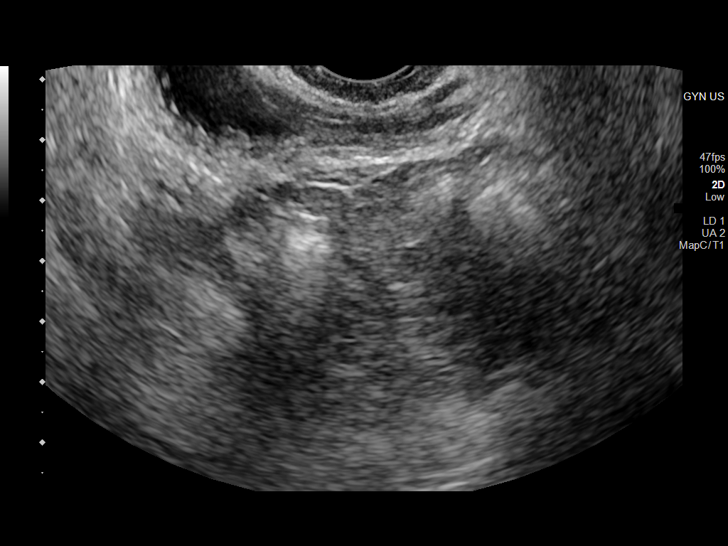
[im 53/64]
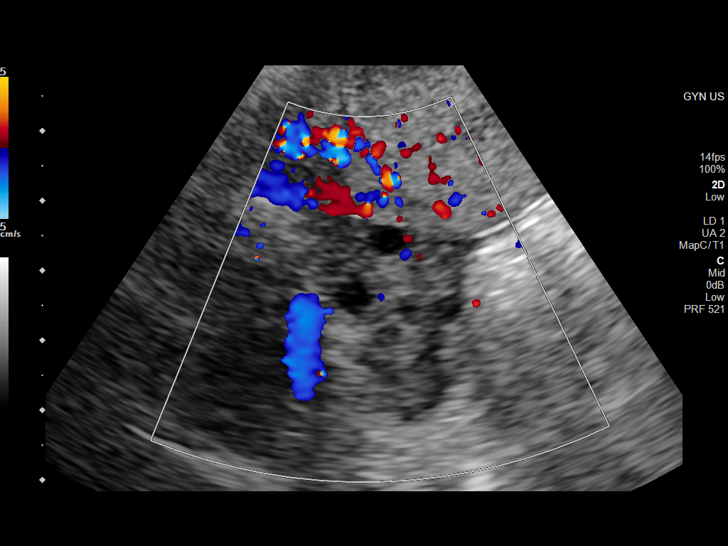
[im 58/64]
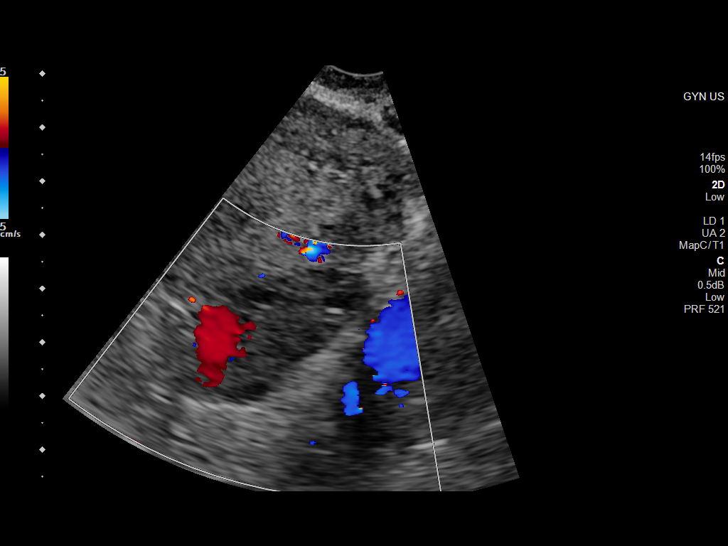
[im 64/64]
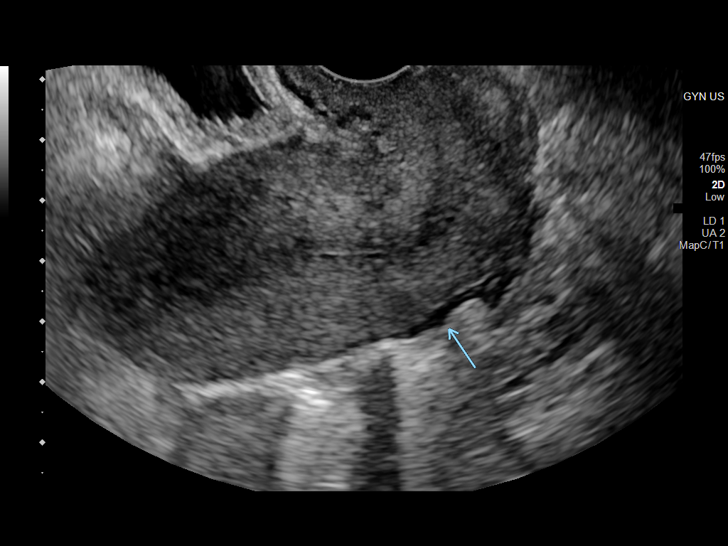

[13 of 25 positions shown; findings below may reference images not displayed]

FINDINGS: Uterus

Measurements: 7.3 x 4 x 5.8 cm = volume: 90 mL. No fibroids or other
mass visualized.

Endometrium

Thickness: 3.1 mm.  No focal abnormality visualized.

Right ovary

Measurements: 3.3 x 2.9 x 2.6 cm = volume: 12.9 mL. Normal
appearance/no adnexal mass.

Left ovary

Measurements: 3.8 x 1.8 x 1.8 cm = volume: 6.7 mL. Normal
appearance/no adnexal mass.

Pulsed Doppler evaluation of both ovaries demonstrates normal
low-resistance arterial and venous waveforms.

Other findings

Trace free fluid
IMPRESSION: 1. Negative for ovarian torsion or ovarian mass lesion.
2. Trace free fluid in the pelvis
